# Patient Record
Sex: Male | Born: 1955
Health system: Southern US, Community
[De-identification: ages and names within clinical notes are randomized; demographics above are authoritative.]

## PROBLEM LIST (undated history)

## (undated) DIAGNOSIS — A4902 Methicillin resistant Staphylococcus aureus infection, unspecified site: Secondary | ICD-10-CM

## (undated) DIAGNOSIS — I10 Essential (primary) hypertension: Secondary | ICD-10-CM

## (undated) DIAGNOSIS — IMO0002 Reserved for concepts with insufficient information to code with codable children: Secondary | ICD-10-CM

## (undated) DIAGNOSIS — G43909 Migraine, unspecified, not intractable, without status migrainosus: Secondary | ICD-10-CM

## (undated) DIAGNOSIS — L0231 Cutaneous abscess of buttock: Secondary | ICD-10-CM

## (undated) DIAGNOSIS — K219 Gastro-esophageal reflux disease without esophagitis: Secondary | ICD-10-CM

## (undated) DIAGNOSIS — K259 Gastric ulcer, unspecified as acute or chronic, without hemorrhage or perforation: Secondary | ICD-10-CM

## (undated) DIAGNOSIS — N529 Male erectile dysfunction, unspecified: Secondary | ICD-10-CM

## (undated) DIAGNOSIS — D489 Neoplasm of uncertain behavior, unspecified: Secondary | ICD-10-CM

## (undated) DIAGNOSIS — L02415 Cutaneous abscess of right lower limb: Secondary | ICD-10-CM

## (undated) HISTORY — DX: Reserved for concepts with insufficient information to code with codable children: IMO0002

## (undated) HISTORY — DX: Male erectile dysfunction, unspecified: N52.9

## (undated) HISTORY — DX: Gastro-esophageal reflux disease without esophagitis: K21.9

## (undated) HISTORY — PX: FINGER TENDON REPAIR: SHX1640

## (undated) HISTORY — DX: Migraine, unspecified, not intractable, without status migrainosus: G43.909

## (undated) HISTORY — DX: Gastric ulcer, unspecified as acute or chronic, without hemorrhage or perforation: K25.9

## (undated) HISTORY — DX: Methicillin resistant Staphylococcus aureus infection, unspecified site: A49.02

## (undated) HISTORY — DX: Cutaneous abscess of right lower limb: L02.415

## (undated) HISTORY — DX: Cutaneous abscess of buttock: L02.31

## (undated) HISTORY — DX: Neoplasm of uncertain behavior, unspecified: D48.9

---

## 2011-08-08 ENCOUNTER — Ambulatory Visit: Payer: Self-pay | Admitting: Gastroenterology

## 2011-08-08 HISTORY — PX: COLONOSCOPY: SHX174

## 2011-08-08 LAB — HM COLONOSCOPY: HM Colonoscopy: 1

## 2013-06-17 DIAGNOSIS — IMO0002 Reserved for concepts with insufficient information to code with codable children: Secondary | ICD-10-CM

## 2013-06-17 HISTORY — DX: Reserved for concepts with insufficient information to code with codable children: IMO0002

## 2014-05-03 ENCOUNTER — Emergency Department: Payer: Self-pay | Admitting: Emergency Medicine

## 2014-05-10 ENCOUNTER — Emergency Department: Payer: Self-pay | Admitting: Emergency Medicine

## 2015-04-18 DIAGNOSIS — A4902 Methicillin resistant Staphylococcus aureus infection, unspecified site: Secondary | ICD-10-CM

## 2015-04-18 HISTORY — DX: Methicillin resistant Staphylococcus aureus infection, unspecified site: A49.02

## 2015-07-24 DIAGNOSIS — N529 Male erectile dysfunction, unspecified: Secondary | ICD-10-CM | POA: Insufficient documentation

## 2015-07-24 DIAGNOSIS — K259 Gastric ulcer, unspecified as acute or chronic, without hemorrhage or perforation: Secondary | ICD-10-CM | POA: Insufficient documentation

## 2015-07-24 DIAGNOSIS — D489 Neoplasm of uncertain behavior, unspecified: Secondary | ICD-10-CM | POA: Insufficient documentation

## 2015-08-02 ENCOUNTER — Ambulatory Visit (INDEPENDENT_AMBULATORY_CARE_PROVIDER_SITE_OTHER): Payer: BLUE CROSS/BLUE SHIELD | Admitting: Family Medicine

## 2015-08-02 ENCOUNTER — Encounter: Payer: Self-pay | Admitting: Family Medicine

## 2015-08-02 VITALS — BP 139/83 | HR 67 | Temp 98.2°F | Ht 67.0 in | Wt 165.0 lb

## 2015-08-02 DIAGNOSIS — Z0000 Encounter for general adult medical examination without abnormal findings: Secondary | ICD-10-CM

## 2015-08-02 DIAGNOSIS — N529 Male erectile dysfunction, unspecified: Secondary | ICD-10-CM | POA: Diagnosis not present

## 2015-08-02 MED ORDER — SILDENAFIL CITRATE 20 MG PO TABS: ORAL_TABLET | ORAL | Status: DC

## 2015-08-02 MED ORDER — TADALAFIL 5 MG PO TABS: 5.0000 mg | ORAL_TABLET | Freq: Every day | ORAL | Status: DC | PRN

## 2015-08-02 NOTE — Progress Notes (Signed)
Patient ID: Philip Atkins, male   DOB: 02/03/1956, 59 y.o.   MRN: [id number]  Subjective:   Philip Atkins is a 59 y.o. male here for a complete physical exam  Interim issues since last visit: tetanus booster with 20 stitches over right eye, head since last physical Feeling well overall Mosquito and spider bites this week; no tick bites  USPSTF grade A and B recommendations discussed Alcohol: n/a Tobacco: 8-10 cigs a day; tried e-cigs, then gave those up b/c formaldehyde HTN: checks at home, white coat up, controlled today; 120/78 at home Obesity: n/a HIV, Hep B, Hep C: declined Lipids: done at work already this year in the spring Glucose: ditto Colonoscopy: due next fall Lung cancer: not 30 pack year hx; will add up next year and consider Breast cancer: no males in the family or male with breast hx Osteoporosis: low risk AAA: not old enough, will get at 65 Aspirin: not taking Diet: few eggs; few fatty meats, mostly chicken; greens, dairy Exercise: walks a lot at work, 9-12 miles a day Skin cancer: already had skin cancer; learned lesson, for last 14-15 years, avoids sun, decent hat, sunscreen   Past Medical History  Diagnosis Date  . Neoplasm of uncertain behavior   . ED (erectile dysfunction)   . Gastric ulceration   . Migraines   . Squamous cell carcinoma 06/2013   Past Surgical History  Procedure Laterality Date  . Colonoscopy  08/08/11    1 benign polyp; one tubular adenoma; repeat 5 years   Family History  Problem Relation Age of Onset  . Heart attack Mother   . Hypertension Mother   . Asthma Brother    Social History  Substance Use Topics  . Smoking status: Current Every Day Smoker    Types: Cigarettes  . Smokeless tobacco: Never Used  . Alcohol Use: No   Review of Systems  Constitutional: Negative for fever and unexpected weight change.  HENT: Positive for congestion (sinus issues, weather change, pollen). Negative for nosebleeds.   Eyes: Negative  for visual disturbance.       Will have eyes tested  Respiratory: Negative for shortness of breath.   Cardiovascular: Negative for chest pain.  Gastrointestinal: Negative for abdominal pain and blood in stool.  Endocrine: Negative for cold intolerance, heat intolerance and polydipsia.  Genitourinary: Negative for hematuria, decreased urine volume and difficulty urinating.  Musculoskeletal: Negative for joint swelling and arthralgias.  Skin:       No worrisome moles  Allergic/Immunologic: Positive for environmental allergies (uses saline).  Neurological: Negative for tremors.  Hematological: Does not bruise/bleed easily.  Psychiatric/Behavioral: Negative for sleep disturbance and dysphoric mood.    Objective:   Filed Vitals:   08/02/15 1354  BP: 139/83  Pulse: 67  Temp: 98.2 F (36.8 C)  Height: 5\' 7"  (1.702 m)  Weight: 165 lb (74.844 kg)  SpO2: 97%   Body mass index is 25.84 kg/(m^2). Wt Readings from Last 3 Encounters:  08/02/15 165 lb (74.844 kg)  10/27/14 168 lb (76.204 kg)   Physical Exam  Constitutional: He appears well-developed and well-nourished. No distress.  HENT:  Head: Normocephalic and atraumatic.  Nose: Nose normal.  Mouth/Throat: Oropharynx is clear and moist.  Eyes: EOM are normal. No scleral icterus.  Neck: No JVD present. No thyromegaly present.  Cardiovascular: Normal rate, regular rhythm and normal heart sounds.   Pulmonary/Chest: Effort normal and breath sounds normal. No respiratory distress. He has no wheezes. He has no rales.  Abdominal: Soft.  Bowel sounds are normal. He exhibits no distension. There is no tenderness. There is no guarding.  Musculoskeletal: Normal range of motion. He exhibits no edema.  Lymphadenopathy:    He has no cervical adenopathy.  Neurological: He is alert. He displays normal reflexes. He exhibits normal muscle tone. Coordination normal.  Skin: Skin is warm and dry. No rash noted. He is not diaphoretic. No erythema. No  pallor.  Psychiatric: He has a normal mood and affect. His behavior is normal. Judgment and thought content normal.   Assessment/Plan:   Problem List Items Addressed This Visit      Genitourinary   ED (erectile dysfunction)    He would like prescriptions for the Cialis and sildenafil and see which works better; he knows to NOT take BOTH; he is aware of reason to go to ER (prolonged erection); do NOT take nitrates, etc.        Other   Preventative health care - Primary    Healthy living encouraged, USPSTF grade A and B recommendations reviewed; labs done through work; don't text and drive; check smoke detector batteries, etc; see AVS; return in one year for next physical         Meds ordered this encounter  Medications  . tadalafil (CIALIS) 5 MG tablet    Sig: Take 1 tablet (5 mg total) by mouth daily as needed for erectile dysfunction.    Dispense:  15 tablet    Refill:  6  . sildenafil (REVATIO) 20 MG tablet    Sig: One or two pills by mouth daily prior to intimacy; do NOT take with Cialis    Dispense:  30 tablet    Refill:  5   Follow up plan: Return in about 1 year (around 08/01/2016) for next physical.  An after-visit summary was printed and given to the patient at [place].  Please see the patient instructions which may contain other information and recommendations beyond what is mentioned above in the assessment and plan.  Meds ordered this encounter  Medications  . tadalafil (CIALIS) 5 MG tablet    Sig: Take 1 tablet (5 mg total) by mouth daily as needed for erectile dysfunction.    Dispense:  15 tablet    Refill:  6  . sildenafil (REVATIO) 20 MG tablet    Sig: One or two pills by mouth daily prior to intimacy; do NOT take with Cialis    Dispense:  30 tablet    Refill:  5  Patient to send labs done through work for health screening to me for review

## 2015-08-02 NOTE — Patient Instructions (Addendum)
A healthy BMI (or body mass index) is between 18.5 and 24.9 You can calculate your ideal BMI at the [place] website ClubMonetize.fr I do recommend 81 mg coated aspirin daily for heart protection We recommend no more than three egg yolks per week You can use either Cialis OR sildenafil (NOT both); never use either with nitrates Return in one year for next physical Please do feel free to send me copies of work labs  Health Maintenance A healthy lifestyle and preventative care can promote health and wellness.  Maintain regular health, dental, and eye exams.  Eat a healthy diet. Foods like vegetables, fruits, whole grains, low-fat dairy products, and lean protein foods contain the nutrients you need and are low in calories. Decrease your intake of foods high in solid fats, added sugars, and salt. Get information about a proper diet from your health care provider, if necessary.  Regular physical exercise is one of the most important things you can do for your health. Most adults should get at least 150 minutes of moderate-intensity exercise (any activity that increases your heart rate and causes you to sweat) each week. In addition, most adults need muscle-strengthening exercises on 2 or more days a week.   Maintain a healthy weight. The body mass index (BMI) is a screening tool to identify possible weight problems. It provides an estimate of body fat based on height and weight. Your health care provider can find your BMI and can help you achieve or maintain a healthy weight. For males 20 years and older:  A BMI below 18.5 is considered underweight.  A BMI of 18.5 to 24.9 is normal.  A BMI of 25 to 29.9 is considered overweight.  A BMI of 30 and above is considered obese.  Maintain normal blood lipids and cholesterol by exercising and minimizing your intake of saturated fat. Eat a balanced diet with plenty of fruits and vegetables. Blood tests  for lipids and cholesterol should begin at age 75 and be repeated every 5 years. If your lipid or cholesterol levels are high, you are over age 92, or you are at high risk for heart disease, you may need your cholesterol levels checked more frequently.Ongoing high lipid and cholesterol levels should be treated with medicines if diet and exercise are not working.  If you smoke, find out from your health care provider how to quit. If you do not use tobacco, do not start.  Lung cancer screening is recommended for adults aged 33-80 years who are at high risk for developing lung cancer because of a history of smoking. A yearly low-dose CT scan of the lungs is recommended for people who have at least a 30-pack-year history of smoking and are current smokers or have quit within the past 15 years. A pack year of smoking is smoking an average of 1 pack of cigarettes a day for 1 year (for example, a 30-pack-year history of smoking could mean smoking 1 pack a day for 30 years or 2 packs a day for 15 years). Yearly screening should continue until the smoker has stopped smoking for at least 15 years. Yearly screening should be stopped for people who develop a health problem that would prevent them from having lung cancer treatment.  If you choose to drink alcohol, do not have more than 2 drinks per day. One drink is considered to be 12 oz (360 mL) of beer, 5 oz (150 mL) of wine, or 1.5 oz (45 mL) of liquor.  Avoid the use  of street drugs. Do not share needles with anyone. Ask for help if you need support or instructions about stopping the use of drugs.  High blood pressure causes heart disease and increases the risk of stroke. Blood pressure should be checked at least every 1-2 years. Ongoing high blood pressure should be treated with medicines if weight loss and exercise are not effective.  If you are 4-12 years old, ask your health care provider if you should take aspirin to prevent heart disease.  Diabetes  screening involves taking a blood sample to check your fasting blood sugar level. This should be done once every 3 years after age 2 if you are at a normal weight and without risk factors for diabetes. Testing should be considered at a younger age or be carried out more frequently if you are overweight and have at least 1 risk factor for diabetes.  Colorectal cancer can be detected and often prevented. Most routine colorectal cancer screening begins at the age of 23 and continues through age 63. However, your health care provider may recommend screening at an earlier age if you have risk factors for colon cancer. On a yearly basis, your health care provider may provide home test kits to check for hidden blood in the stool. A small camera at the end of a tube may be used to directly examine the colon (sigmoidoscopy or colonoscopy) to detect the earliest forms of colorectal cancer. Talk to your health care provider about this at age 62 when routine screening begins. A direct exam of the colon should be repeated every 5-10 years through age 48, unless early forms of precancerous polyps or small growths are found.  People who are at an increased risk for hepatitis B should be screened for this virus. You are considered at high risk for hepatitis B if:  You were born in a country where hepatitis B occurs often. Talk with your health care provider about which countries are considered high risk.  Your parents were born in a high-risk country and you have not received a shot to protect against hepatitis B (hepatitis B vaccine).  You have HIV or AIDS.  You use needles to inject street drugs.  You live with, or have sex with, someone who has hepatitis B.  You are a man who has sex with other men (MSM).  You get hemodialysis treatment.  You take certain medicines for conditions like cancer, organ transplantation, and autoimmune conditions.  Hepatitis C blood testing is recommended for all people born  from [birth year] through 1966 and any individual with known risk factors for hepatitis C.  Healthy men should no longer receive prostate-specific antigen (PSA) blood tests as part of routine cancer screening. Talk to your health care provider about prostate cancer screening.  Testicular cancer screening is not recommended for adolescents or adult males who have no symptoms. Screening includes self-exam, a health care provider exam, and other screening tests. Consult with your health care provider about any symptoms you have or any concerns you have about testicular cancer.  Practice safe sex. Use condoms and avoid high-risk sexual practices to reduce the spread of sexually transmitted infections (STIs).  You should be screened for STIs, including gonorrhea and chlamydia if:  You are sexually active and are younger than 24 years.  You are older than 24 years, and your health care provider tells you that you are at risk for this type of infection.  Your sexual activity has changed since you were last screened,  and you are at an increased risk for chlamydia or gonorrhea. Ask your health care provider if you are at risk.  If you are at risk of being infected with HIV, it is recommended that you take a prescription medicine daily to prevent HIV infection. This is called pre-exposure prophylaxis (PrEP). You are considered at risk if:  You are a man who has sex with other men (MSM).  You are a heterosexual man who is sexually active with multiple partners.  You take drugs by injection.  You are sexually active with a partner who has HIV.  Talk with your health care provider about whether you are at high risk of being infected with HIV. If you choose to begin PrEP, you should first be tested for HIV. You should then be tested every 3 months for as long as you are taking PrEP.  Use sunscreen. Apply sunscreen liberally and repeatedly throughout the day. You should seek shade when your shadow is shorter  than you. Protect yourself by wearing long sleeves, pants, a wide-brimmed hat, and sunglasses year round whenever you are outdoors.  Tell your health care provider of new moles or changes in moles, especially if there is a change in shape or color. Also, tell your health care provider if a mole is larger than the size of a pencil eraser.  A one-time screening for abdominal aortic aneurysm (AAA) and surgical repair of large AAAs by ultrasound is recommended for men aged 60-75 years who are current or former smokers.  Stay current with your vaccines (immunizations). Document Released: 05/01/2008 Document Revised: 11/08/2013 Document Reviewed: 03/31/2011 [facility] Patient Information 2015 [place], [place]. This information is not intended to replace advice given to you by your health care provider. Make sure you discuss any questions you have with your health care provider.

## 2015-08-02 NOTE — Assessment & Plan Note (Signed)
He would like prescriptions for the Cialis and sildenafil and see which works better; he knows to NOT take BOTH; he is aware of reason to go to ER (prolonged erection); do NOT take nitrates, etc.

## 2015-08-02 NOTE — Assessment & Plan Note (Signed)
Healthy living encouraged, USPSTF grade A and B recommendations reviewed; labs done through work; don't text and drive; check smoke detector batteries, etc; see AVS; return in one year for next physical

## 2015-09-10 ENCOUNTER — Emergency Department
Admission: EM | Admit: 2015-09-10 | Discharge: 2015-09-10 | Disposition: A | Discharge status: Home / Self Care | Payer: Worker's Compensation | Attending: Internal Medicine | Admitting: Internal Medicine

## 2015-09-10 ENCOUNTER — Emergency Department: Payer: Worker's Compensation

## 2015-09-10 ENCOUNTER — Encounter: Payer: Self-pay | Admitting: Emergency Medicine

## 2015-09-10 DIAGNOSIS — S92401B Displaced unspecified fracture of right great toe, initial encounter for open fracture: Secondary | ICD-10-CM

## 2015-09-10 DIAGNOSIS — Y9389 Activity, other specified: Secondary | ICD-10-CM | POA: Diagnosis not present

## 2015-09-10 DIAGNOSIS — Y990 Civilian activity done for income or pay: Secondary | ICD-10-CM | POA: Diagnosis not present

## 2015-09-10 DIAGNOSIS — W208XXA Other cause of strike by thrown, projected or falling object, initial encounter: Secondary | ICD-10-CM | POA: Diagnosis not present

## 2015-09-10 DIAGNOSIS — Z792 Long term (current) use of antibiotics: Secondary | ICD-10-CM | POA: Diagnosis not present

## 2015-09-10 DIAGNOSIS — Y9289 Other specified places as the place of occurrence of the external cause: Secondary | ICD-10-CM | POA: Insufficient documentation

## 2015-09-10 DIAGNOSIS — Z79899 Other long term (current) drug therapy: Secondary | ICD-10-CM | POA: Diagnosis not present

## 2015-09-10 DIAGNOSIS — Z7982 Long term (current) use of aspirin: Secondary | ICD-10-CM | POA: Insufficient documentation

## 2015-09-10 DIAGNOSIS — Z720 Tobacco use: Secondary | ICD-10-CM | POA: Insufficient documentation

## 2015-09-10 DIAGNOSIS — S92421A Displaced fracture of distal phalanx of right great toe, initial encounter for closed fracture: Secondary | ICD-10-CM | POA: Insufficient documentation

## 2015-09-10 DIAGNOSIS — S97111A Crushing injury of right great toe, initial encounter: Secondary | ICD-10-CM

## 2015-09-10 IMAGING — CT CT HEAD WITHOUT CONTRAST
1 series · 16 of 30 positions shown, 20 images · non-contrast
Comparison: None.

CLINICAL DATA: Head trauma, laceration

EXAM:
CT HEAD WITHOUT CONTRAST
TECHNIQUE: Contiguous axial images were obtained from the base of the skull
through the vertex without intravenous contrast.

[Series 2: head wo · axial · 0.43mm/px · z∈[+14,+168]mm · 16 of 36 slices shown, 20 images]
[im 2/36  brain]
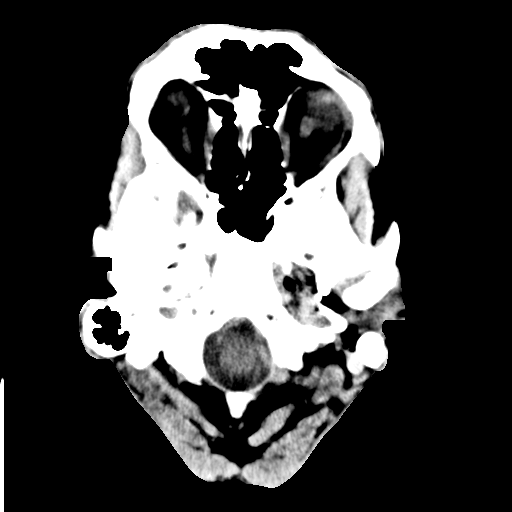
[im 2/36  bone]
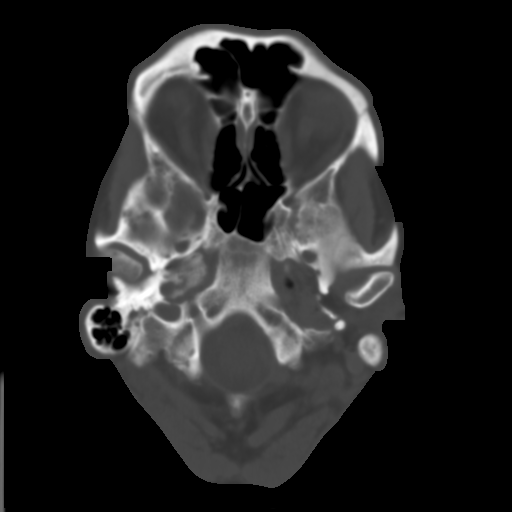
[im 4/36  brain]
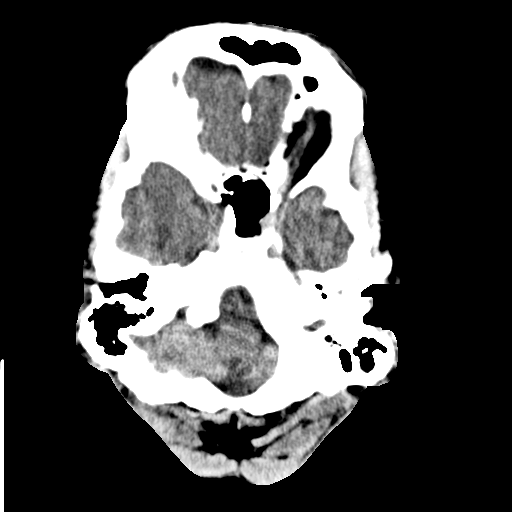
[im 7/36  brain]
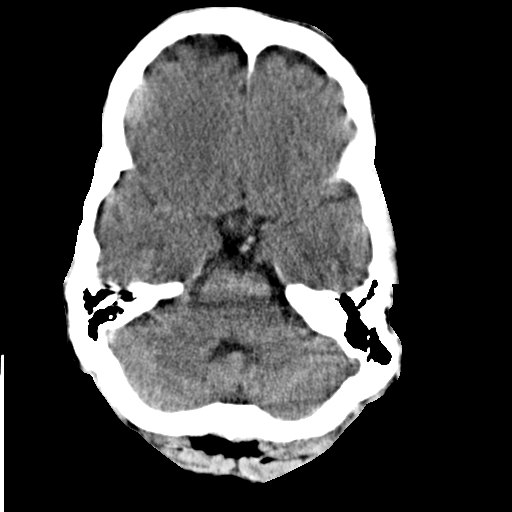
[im 9/36  brain]
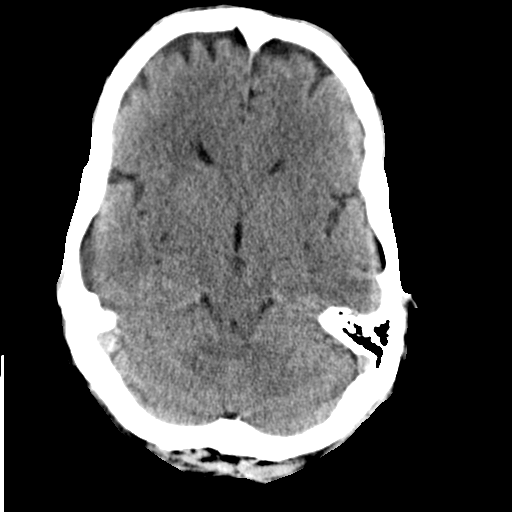
[im 10/36  brain]
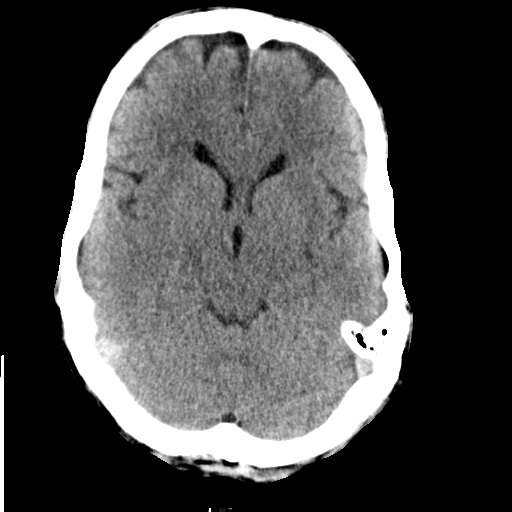
[im 10/36  bone]
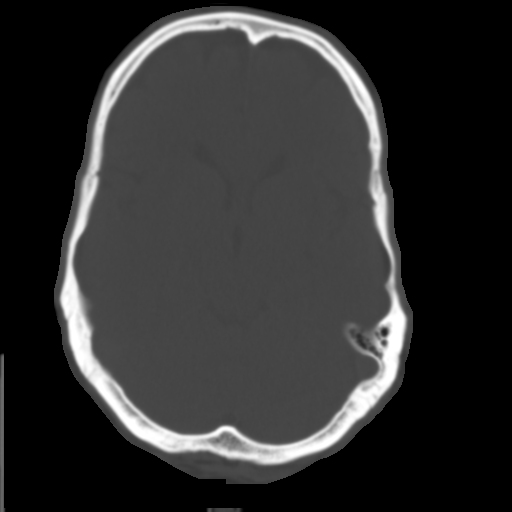
[im 13/36  brain]
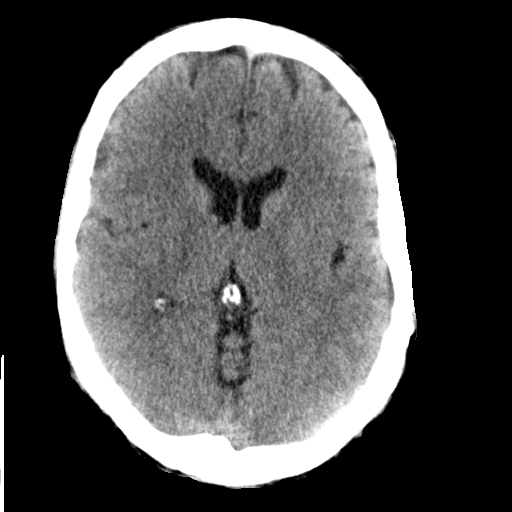
[im 15/36  brain]
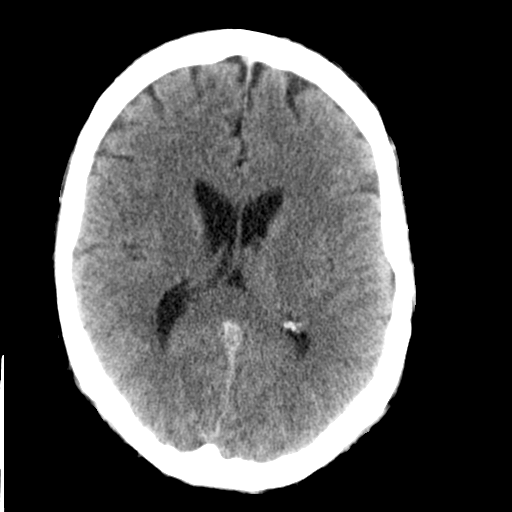
[im 17/36  brain]
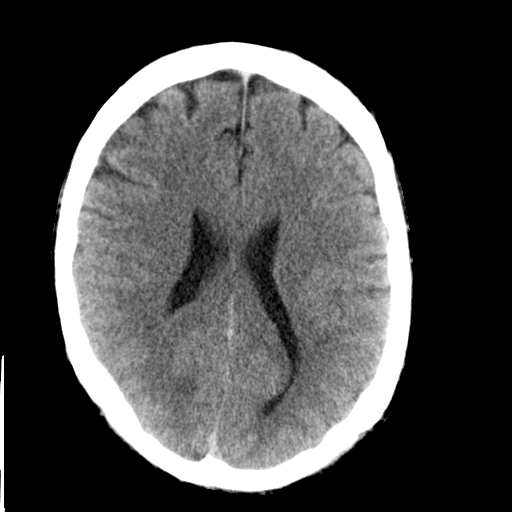
[im 19/36  brain]
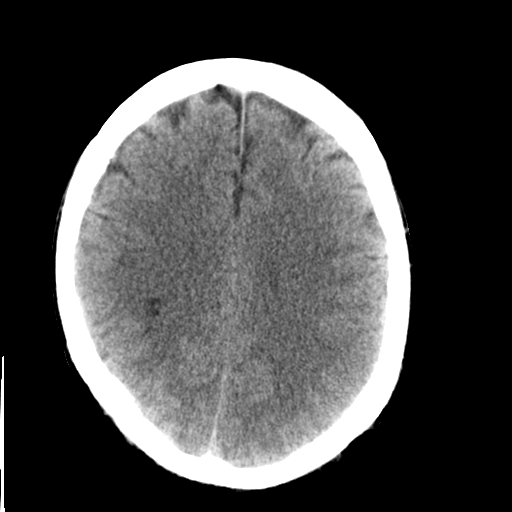
[im 19/36  bone]
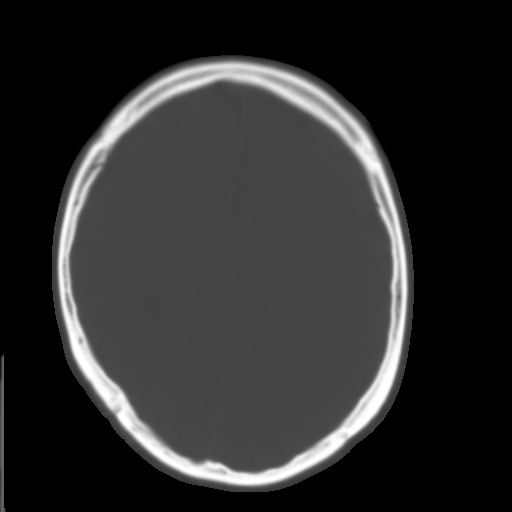
[im 21/36  brain]
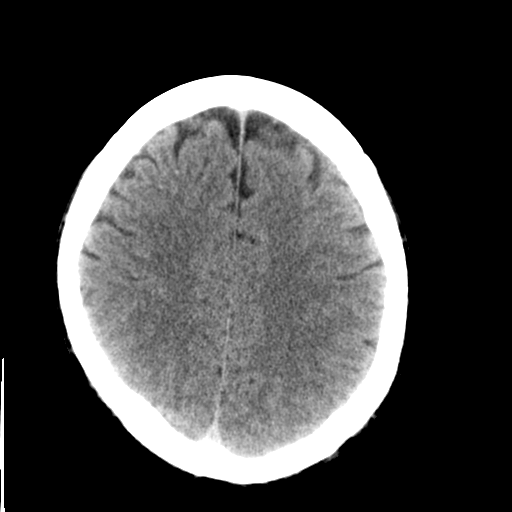
[im 23/36  brain]
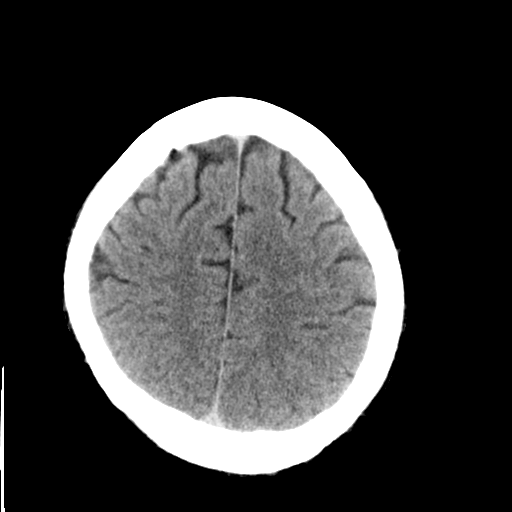
[im 26/36  brain]
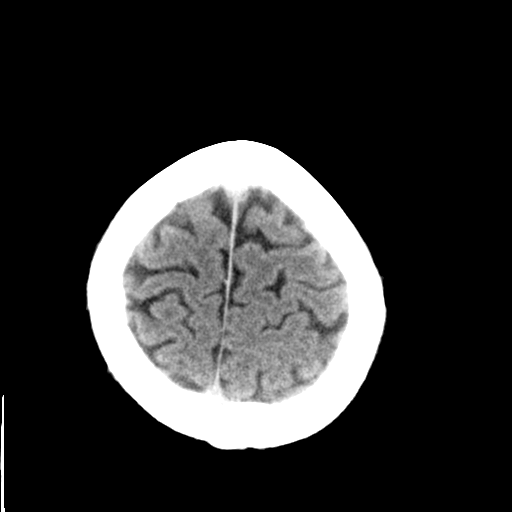
[im 27/36  brain]
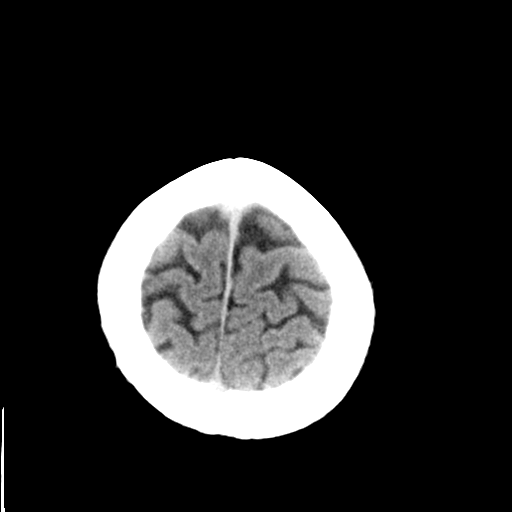
[im 27/36  bone]
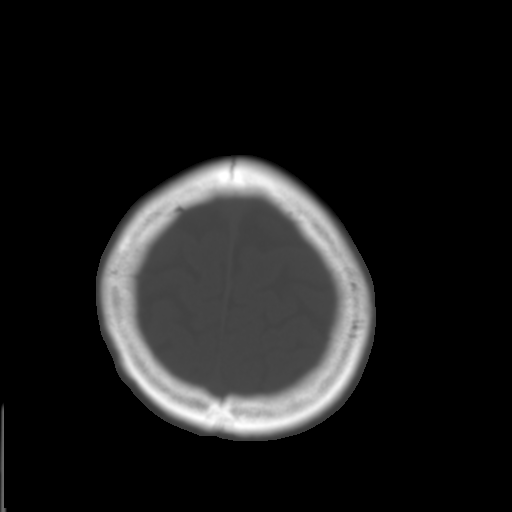
[im 29/36  brain]
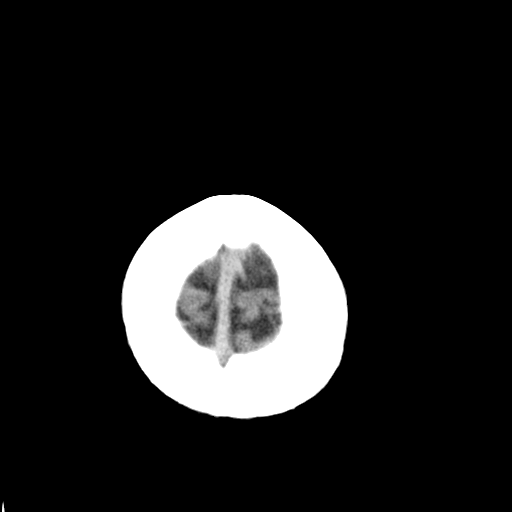
[im 32/36  brain]
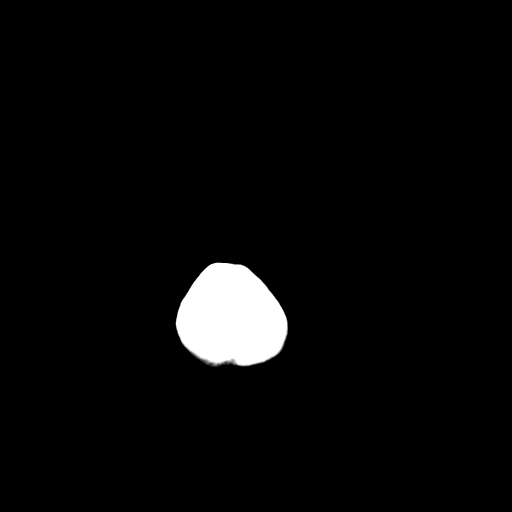
[im 34/36  brain]
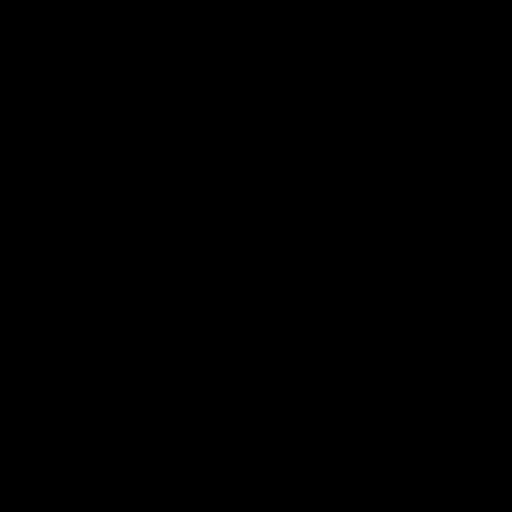

[16 of 30 positions shown; findings below may reference images not displayed]

FINDINGS: No skull fracture is noted. Paranasal sinuses and mastoid air cells
are unremarkable. No intracranial hemorrhage, mass effect or midline
shift. No intraventricular hemorrhage.

No hydrocephalus. The gray and white-matter differentiation is
preserved. No acute infarction. No mass lesion is noted on this
unenhanced scan.
IMPRESSION: No acute intracranial abnormality.

## 2015-09-10 MED ORDER — HYDROCODONE-ACETAMINOPHEN 5-325 MG PO TABS
2.0000 | ORAL_TABLET | Freq: Once | ORAL | Status: AC
  Administered 2015-09-10: 2 via ORAL
  Filled 2015-09-10: qty 2

## 2015-09-10 MED ORDER — CEPHALEXIN 500 MG PO CAPS: 500.0000 mg | ORAL_CAPSULE | Freq: Four times a day (QID) | ORAL | Status: DC

## 2015-09-10 MED ORDER — HYDROCODONE-ACETAMINOPHEN 5-325 MG PO TABS: 1.0000 | ORAL_TABLET | ORAL | Status: DC | PRN

## 2015-09-10 MED ORDER — CEPHALEXIN 500 MG PO CAPS
500.0000 mg | ORAL_CAPSULE | Freq: Once | ORAL | Status: AC
  Administered 2015-09-10: 500 mg via ORAL
  Filled 2015-09-10: qty 1

## 2015-09-10 MED ORDER — BACITRACIN ZINC 500 UNIT/GM EX OINT
TOPICAL_OINTMENT | CUTANEOUS | Status: AC
  Administered 2015-09-10: 1
  Filled 2015-09-10: qty 0.9

## 2015-09-10 NOTE — Discharge Instructions (Signed)
Crush Injury, Fingers or Toes A crush injury means the fingers or toes are hurt by being squeezed (compressed). HOME CARE  Raise (elevate) the injured part above the level of your heart. Do this as much as you can for the first few days.  Put ice on the injured area.  Put ice in a plastic bag.  Place a towel between your skin and the bag.  Leave the ice on for 15-20 minutes, 03-04 times a day for the first 2 days.  Only take medicine as told by your doctor.  Use the injured part only as told by your doctor.  Change bandages (dressings) as told by your doctor.  Keep all doctor visits as told. GET HELP RIGHT AWAY IF:   There is redness, puffiness (swelling), or more pain in the injured finger or toe.  Yellowish-white fluid (pus) comes from the wound.  You have a fever.  A bad smell comes from the wound or bandage.  The wound breaks open.  You cannot move the injured finger or toe. MAKE SURE YOU:   Understand these instructions.  Will watch your condition.  Will get help right away if you are not doing well or get worse.   This information is not intended to replace advice given to you by your health care provider. Make sure you discuss any questions you have with your health care provider.   Document Released: 04/23/2010 Document Revised: 01/26/2012 Document Reviewed: 03/21/2011 Elsevier Interactive Patient Education [employer].

## 2015-09-10 NOTE — ED Notes (Signed)
Foot cleansed, antibiotic ointment applied, sterile gauze over, kerlix wrap, wooden shoe

## 2015-09-10 NOTE — ED Provider Notes (Signed)
[facility] Emergency Department Provider Note  ____________________________________________  Time seen: Approximately 2:36 PM  I have reviewed the triage vital signs and the nursing notes.   HISTORY  Chief Complaint Foot Injury   HPI Philip Atkins is a 59 y.o. male here with injury to his right great toe. Patient was at work when a spring loaded plate came down on his foot. Patient was brought by a coworker/supervisor to the emergency room and his wife will be picking him up. Patient has had a tetanus shot within the last year. He does not have a picture ID so low risk, drug screen was not done. Patient rates his pain as 5 out of 10.   Past Medical History  Diagnosis Date  . Neoplasm of uncertain behavior   . ED (erectile dysfunction)   . Gastric ulceration   . Migraines   . Squamous cell carcinoma ([place]) 06/2013    Patient Active Problem List   Diagnosis Date Noted  . Preventative health care 08/02/2015  . Neoplasm of uncertain behavior   . ED (erectile dysfunction)   . Gastric ulceration     Past Surgical History  Procedure Laterality Date  . Colonoscopy  08/08/11    1 benign polyp; one tubular adenoma; repeat 5 years    Current Outpatient Rx  Name  Route  Sig  Dispense  Refill  . acetaminophen (TYLENOL) 500 MG tablet   Oral   Take 500 mg by mouth 2 (two) times daily.         Marland Kitchen aspirin EC 81 MG tablet   Oral   Take 81 mg by mouth daily.         . cephALEXin (KEFLEX) 500 MG capsule   Oral   Take 1 capsule (500 mg total) by mouth 4 (four) times daily.   28 capsule   0   . HYDROcodone-acetaminophen (NORCO/VICODIN) 5-325 MG tablet   Oral   Take 1-2 tablets by mouth every 4 (four) hours as needed for moderate pain.   30 tablet   0   . Multiple Vitamin (MULTIVITAMIN) tablet   Oral   Take 1 tablet by mouth daily.         . sildenafil (REVATIO) 20 MG tablet      One or two pills by mouth daily prior to intimacy; do NOT take  with Cialis   30 tablet   5   . tadalafil (CIALIS) 5 MG tablet   Oral   Take 1 tablet (5 mg total) by mouth daily as needed for erectile dysfunction.   15 tablet   6     Allergies Review of patient's allergies indicates no known allergies.  Family History  Problem Relation Age of Onset  . Heart attack Mother   . Hypertension Mother   . Asthma Brother     Social History Social History  Substance Use Topics  . Smoking status: Current Every Day Smoker -- 0.50 packs/day    Types: Cigarettes  . Smokeless tobacco: Never Used  . Alcohol Use: No    Review of Systems Constitutional: No fever/chills Cardiovascular: Denies chest pain. Respiratory: Denies shortness of breath. Gastrointestinal:   No nausea, no vomiting.  Musculoskeletal: Positive right foot pain Skin: Negative for rash. 10-point ROS otherwise negative.  ____________________________________________   PHYSICAL EXAM:  VITAL SIGNS: ED Triage Vitals  Enc Vitals Group     BP 09/10/15 1418 140/84 mmHg     Pulse Rate 09/10/15 1418 64  Resp 09/10/15 1418 18     Temp 09/10/15 1418 98.1 F (36.7 C)     Temp Source 09/10/15 1418 Oral     SpO2 09/10/15 1418 94 %     Weight 09/10/15 1418 168 lb (76.204 kg)     Height 09/10/15 1418 5\' 7"  (1.702 m)     Head Cir --      Peak Flow --      Pain Score 09/10/15 1421 5     Pain Loc --      Pain Edu? --      Excl. in [place]? --     Constitutional: Alert and oriented. Well appearing and in no acute distress. Eyes: Conjunctivae are normal. PERRL. EOMI. Head: Atraumatic. Nose: No congestion/rhinnorhea. Neck: No stridor.   Cardiovascular: Normal rate, regular rhythm. Grossly normal heart sounds.  Good peripheral circulation. Respiratory: Normal respiratory effort.  No retractions. Lungs CTAB. Gastrointestinal: Soft and nontender. No distention.  Musculoskeletal: Right toe with evidence of bleeding and nail intact. Cuticle is disrupted. No active bleeding was noted on  exam. Range of motion is limited secondary to pain. There is some edema present. There is some superficial appearing lacerations dorsal aspect of the great toe. Cap refill less than 2 seconds and motor sensory function intact. Neurologic:  Normal speech and language. No gross focal neurologic deficits are appreciated. No gait instability. Skin:  Skin is warm, dry and intact. See above injury under muscle skeletal. Psychiatric: Mood and affect are normal. Speech and behavior are normal.  ____________________________________________   LABS (all labs ordered are listed, but only abnormal results are displayed)  Labs Reviewed - No data to display   RADIOLOGY  Right great toe radiologist and reviewed by me shows comminuted fracture of the first digit distal phalanx. I, Johnn Hai, personally viewed and evaluated these images (plain radiographs) as part of my medical decision making.  ____________________________________________   PROCEDURES  Procedure(s) performed: None  Critical Care performed: No  ____________________________________________   INITIAL IMPRESSION / ASSESSMENT AND PLAN / ED COURSE  Pertinent labs & imaging results that were available during my care of the patient were reviewed by me and considered in my medical decision making (see chart for details).  Patient is to follow-up with podiatry for his fractured great toe. He is also placed on Keflex 500 mg 4 times a day for 7 days. Patient was given a prescription for Norco as needed for pain. He'll be given a note for work stating that he is to be in a wooden shoe. He is to return to the emergency room if any severe worsening of his symptoms or any signs of infection. ____________________________________________   FINAL CLINICAL IMPRESSION(S) / ED DIAGNOSES  Final diagnoses:  Fracture of right great toe, open, initial encounter  Crushing injury of right great toe, initial encounter      Johnn Hai,  PA-C 09/10/15 1557  Earleen Newport, MD 09/11/15 1300

## 2015-09-10 NOTE — ED Notes (Signed)
Pt does not have any id.  Nurse is aware.  No w/c done

## 2015-09-10 NOTE — ED Notes (Signed)
States dock plate came down on foot, bleeding noted through dressing

## 2015-11-02 ENCOUNTER — Encounter: Payer: Self-pay | Admitting: Family Medicine

## 2015-11-02 ENCOUNTER — Ambulatory Visit (INDEPENDENT_AMBULATORY_CARE_PROVIDER_SITE_OTHER): Payer: BLUE CROSS/BLUE SHIELD | Admitting: Family Medicine

## 2015-11-02 VITALS — BP 163/78 | HR 61 | Temp 98.2°F | Wt 173.0 lb

## 2015-11-02 DIAGNOSIS — IMO0001 Reserved for inherently not codable concepts without codable children: Secondary | ICD-10-CM

## 2015-11-02 DIAGNOSIS — R030 Elevated blood-pressure reading, without diagnosis of hypertension: Secondary | ICD-10-CM

## 2015-11-02 DIAGNOSIS — L02415 Cutaneous abscess of right lower limb: Secondary | ICD-10-CM | POA: Diagnosis not present

## 2015-11-02 DIAGNOSIS — L0231 Cutaneous abscess of buttock: Secondary | ICD-10-CM | POA: Diagnosis not present

## 2015-11-02 MED ORDER — SULFAMETHOXAZOLE-TRIMETHOPRIM 800-160 MG PO TABS: 1.0000 | ORAL_TABLET | Freq: Two times a day (BID) | ORAL | Status: DC

## 2015-11-02 NOTE — Patient Instructions (Signed)
Warm compresses over sites Start antibiotics Please do eat yogurt daily or take a probiotic daily for the next month or two We want to replace the healthy germs in the gut If you notice foul, watery diarrhea in the next two months, schedule an appointment RIGHT AWAY

## 2015-11-02 NOTE — Progress Notes (Signed)
BP 163/78 mmHg  Pulse 61  Temp(Src) 98.2 F (36.8 C)  Wt 173 lb (78.472 kg)  SpO2 98%   Subjective:    Patient ID: Philip Atkins, male    DOB: 10/05/1956, 59 y.o.   MRN: [id number]  HPI: Philip Atkins is a 59 y.o. male  Chief Complaint  Patient presents with  . MRSA    He was diagnosed with MRSA in the past, he had one in his groin area but it's going away but has a new one on his buttocks.   He has a spot on the right hip which broke out, got red, then drainage white pus and it's getting better, but now has another spot on the buttock; no fevers, just headaches; feels tired; no swollen lymph nodes He thinks he has a spot of MRSA; came out and erupted and burst on its own; he has had this before  Had this back in July; thinks he got it from his mother-in-law and it went around the house; he took TMP/SMX DS and it cleared up in just 3 days  Broke his right toe 6 weeks ago, went to the hospital; it was a mess he says (showed picture); plate came down on the foot, had to have toenail removed  Relevant past medical, surgical, family and social history reviewed and updated as indicated. Interim medical history since our last visit reviewed. Broke right great toe; dock plate at work, came down on foot; just now back in shoes  His blood pressure is usually 120-130 away from the doctor, just goes up at dentists and doctors and hospitals; white coat hypertension  Allergies and medications reviewed and updated.  Review of Systems  Per HPI unless specifically indicated above     Objective:    BP 163/78 mmHg  Pulse 61  Temp(Src) 98.2 F (36.8 C)  Wt 173 lb (78.472 kg)  SpO2 98%  Wt Readings from Last 3 Encounters:  11/02/15 173 lb (78.472 kg)  09/10/15 168 lb (76.204 kg)  08/02/15 165 lb (74.844 kg)    Physical Exam  Constitutional: He appears well-developed and well-nourished.  Eyes: No scleral icterus.  Cardiovascular: Normal rate.   Pulmonary/Chest: Effort normal.   Lymphadenopathy:       Right: No inguinal adenopathy present.  Skin: Lesion (anterior right hip, erythematous lesion with no active drainage; right buttock, erythematous papule with induration, no drainage) noted.  Psychiatric: He has a normal mood and affect.   Results for orders placed or performed in visit on 07/24/15  HM COLONOSCOPY  Result Value Ref Range   HM Colonoscopy 1 benign polyp, 1 tuubular adenoma; repeat 5 years       Assessment & Plan:   Problem List Items Addressed This Visit      Other   Abscess of right buttock - Primary    Does not appear amenable to I&D at this time, but there is induration and definite erythema suggestive of forming abscess, cellulitis; start ABX; warm compresses to area; contact me or seek care if getting larger; discussed risk of C diff (he is aware), and ingest yogurt or probiotics for next few weks; see AVS      Abscess of right hip    Anterior right hip; no localized inguinal lymphadenopathy; this appears to to be resolving, with minimal erythema, no active drainage; starting ABX for buttocks lesion which should take care of both places      Elevated blood pressure    Patient maintains that this  is white coat hypertension; he will monitor at home and contact me if not controlled         Follow up plan: Return if symptoms worsen or fail to improve.  An after-visit summary was printed and given to the patient at [place].  Please see the patient instructions which may contain other information and recommendations beyond what is mentioned above in the assessment and plan. Meds ordered this encounter  Medications  . DISCONTD: sulfamethoxazole-trimethoprim (BACTRIM DS,SEPTRA DS) 800-160 MG tablet    Sig: Take 1 tablet by mouth 2 (two) times daily.    Dispense:  14 tablet    Refill:  0  . sulfamethoxazole-trimethoprim (BACTRIM DS,SEPTRA DS) 800-160 MG tablet    Sig: Take 1 tablet by mouth 2 (two) times daily.    Dispense:  14 tablet     Refill:  0

## 2015-11-03 DIAGNOSIS — L0231 Cutaneous abscess of buttock: Secondary | ICD-10-CM

## 2015-11-03 DIAGNOSIS — IMO0001 Reserved for inherently not codable concepts without codable children: Secondary | ICD-10-CM | POA: Insufficient documentation

## 2015-11-03 DIAGNOSIS — L02415 Cutaneous abscess of right lower limb: Secondary | ICD-10-CM | POA: Insufficient documentation

## 2015-11-03 DIAGNOSIS — R030 Elevated blood-pressure reading, without diagnosis of hypertension: Secondary | ICD-10-CM

## 2015-11-03 HISTORY — DX: Cutaneous abscess of buttock: L02.31

## 2015-11-03 HISTORY — DX: Cutaneous abscess of right lower limb: L02.415

## 2015-11-03 NOTE — Assessment & Plan Note (Signed)
Does not appear amenable to I&D at this time, but there is induration and definite erythema suggestive of forming abscess, cellulitis; start ABX; warm compresses to area; contact me or seek care if getting larger; discussed risk of C diff (he is aware), and ingest yogurt or probiotics for next few weks; see AVS

## 2015-11-03 NOTE — Assessment & Plan Note (Signed)
Anterior right hip; no localized inguinal lymphadenopathy; this appears to to be resolving, with minimal erythema, no active drainage; starting ABX for buttocks lesion which should take care of both places

## 2015-11-03 NOTE — Assessment & Plan Note (Signed)
Patient maintains that this is white coat hypertension; he will monitor at home and contact me if not controlled

## 2015-12-12 ENCOUNTER — Telehealth: Payer: Self-pay | Admitting: Family Medicine

## 2015-12-12 MED ORDER — SULFAMETHOXAZOLE-TRIMETHOPRIM 800-160 MG PO TABS: 1.0000 | ORAL_TABLET | Freq: Two times a day (BID) | ORAL | Status: DC

## 2015-12-12 MED ORDER — MUPIROCIN CALCIUM 2 % NA OINT: 1.0000 "application " | TOPICAL_OINTMENT | Freq: Two times a day (BID) | NASAL | Status: DC

## 2015-12-12 NOTE — Telephone Encounter (Signed)
Routing to provider for advice.

## 2015-12-12 NOTE — Telephone Encounter (Signed)
Pt calling requesting more antibiotics.  I explained that requires and appt and he refused stating he was just seen a month ago for the same thing.  Please call pt to advise.

## 2015-12-12 NOTE — Telephone Encounter (Signed)
I talked with patient; he is requesting antibiotic; will start oral and nasal; if not getting better, then do return

## 2016-08-12 ENCOUNTER — Encounter (INDEPENDENT_AMBULATORY_CARE_PROVIDER_SITE_OTHER): Payer: Self-pay

## 2016-08-29 ENCOUNTER — Encounter: Payer: BLUE CROSS/BLUE SHIELD | Admitting: Family Medicine

## 2016-09-11 ENCOUNTER — Ambulatory Visit (INDEPENDENT_AMBULATORY_CARE_PROVIDER_SITE_OTHER): Payer: BLUE CROSS/BLUE SHIELD | Admitting: Family Medicine

## 2016-09-11 ENCOUNTER — Other Ambulatory Visit: Payer: Self-pay | Admitting: Family Medicine

## 2016-09-11 ENCOUNTER — Encounter: Payer: Self-pay | Admitting: Family Medicine

## 2016-09-11 VITALS — BP 134/80 | HR 69 | Temp 98.3°F | Ht 67.0 in | Wt 172.8 lb

## 2016-09-11 DIAGNOSIS — Z1211 Encounter for screening for malignant neoplasm of colon: Secondary | ICD-10-CM

## 2016-09-11 DIAGNOSIS — N529 Male erectile dysfunction, unspecified: Secondary | ICD-10-CM | POA: Diagnosis not present

## 2016-09-11 DIAGNOSIS — R030 Elevated blood-pressure reading, without diagnosis of hypertension: Secondary | ICD-10-CM

## 2016-09-11 DIAGNOSIS — Z125 Encounter for screening for malignant neoplasm of prostate: Secondary | ICD-10-CM

## 2016-09-11 DIAGNOSIS — Z13220 Encounter for screening for lipoid disorders: Secondary | ICD-10-CM | POA: Diagnosis not present

## 2016-09-11 DIAGNOSIS — I129 Hypertensive chronic kidney disease with stage 1 through stage 4 chronic kidney disease, or unspecified chronic kidney disease: Secondary | ICD-10-CM | POA: Insufficient documentation

## 2016-09-11 DIAGNOSIS — Z0000 Encounter for general adult medical examination without abnormal findings: Secondary | ICD-10-CM

## 2016-09-11 DIAGNOSIS — F17210 Nicotine dependence, cigarettes, uncomplicated: Secondary | ICD-10-CM | POA: Insufficient documentation

## 2016-09-11 DIAGNOSIS — Z720 Tobacco use: Secondary | ICD-10-CM | POA: Insufficient documentation

## 2016-09-11 MED ORDER — SILDENAFIL CITRATE 20 MG PO TABS: ORAL_TABLET | ORAL | 12 refills | Status: DC

## 2016-09-11 MED ORDER — NICOTINE 7 MG/24HR TD PT24: 7.0000 mg | MEDICATED_PATCH | Freq: Every day | TRANSDERMAL | 0 refills | Status: DC

## 2016-09-11 MED ORDER — NICOTINE 14 MG/24HR TD PT24: 14.0000 mg | MEDICATED_PATCH | Freq: Every day | TRANSDERMAL | 0 refills | Status: DC

## 2016-09-11 NOTE — Assessment & Plan Note (Signed)
Better on recheck. Call with any concerns. Microalbumin done today.

## 2016-09-11 NOTE — Assessment & Plan Note (Signed)
Would like to try patches again. Rx given today. Call with any concerns.

## 2016-09-11 NOTE — Assessment & Plan Note (Signed)
Does well on the sildenifil. Would like to continue with it. Rx given today.

## 2016-09-11 NOTE — Progress Notes (Signed)
BP 134/80 (BP Location: Left Arm, Cuff Size: Normal)   Pulse 69   Temp 98.3 F (36.8 C)   Ht 5\' 7"  (1.702 m)   Wt 172 lb 12.5 oz (78.4 kg)   SpO2 96%   BMI 27.06 kg/m    Subjective:    Patient ID: Philip Atkins, male    DOB: [birth year], 60 y.o.   MRN: [id number]  HPI: Philip Atkins is a 60 y.o. male presenting on 09/11/2016 for comprehensive medical examination. Current medical complaints include:  ELEVATED BLOOD PRESSURE Duration of elevated BP: unknown BP monitoring frequency: a few times a month BP range: 120s/80s Previous BP meds: no Recent stressors: no Family history of hypertension: yes Recurrent headaches: no Visual changes: no Palpitations: no  Dyspnea: no Chest pain: no Lower extremity edema: no Dizzy/lightheaded: no Transient ischemic attacks: no  Was on viagra and cialis, likes the viagra. Does well with it. No concerns. Would like a refill.   SMOKING CESSATION Smoking Status: current every day smoker Smoking Amount: 8-12 cigarettes a day Smoking Quit Date: Not set Smoking triggers: stress Type of tobacco use: cigarettes Children in the house: no Other household members who smoke: no Treatments attempted: patches Pneumovax: Declined  He currently lives with: wife Interim Problems from his last visit: yes, getting over bronchitis had it for past 2.5 weeks, now seeming to do better.   Depression Screen done today and results listed below:  Depression screen [facility] 2/9 09/11/2016  Decreased Interest 0  Down, Depressed, Hopeless 0  PHQ - 2 Score 0     Past Medical History:  Past Medical History:  Diagnosis Date  . Abscess of right buttock 11/03/2015  . Abscess of right hip 11/03/2015  . ED (erectile dysfunction)   . Gastric ulceration   . Migraines   . MRSA (methicillin resistant Staphylococcus aureus) June 2016  . Neoplasm of uncertain behavior   . Neoplasm of uncertain behavior   . Squamous cell carcinoma 06/2013    Surgical History:    Past Surgical History:  Procedure Laterality Date  . COLONOSCOPY  08/08/11   1 benign polyp; one tubular adenoma; repeat 5 years    Medications:  Current Outpatient Prescriptions on File Prior to Visit  Medication Sig  . acetaminophen (TYLENOL) 500 MG tablet Take 500 mg by mouth 2 (two) times daily.  Marland Kitchen aspirin EC 81 MG tablet Take 81 mg by mouth daily.  . Multiple Vitamin (MULTIVITAMIN) tablet Take 1 tablet by mouth daily.   No current facility-administered medications on file prior to visit.     Allergies:  No Known Allergies  Social History:  Social History   Social History  . Marital status: Married    Spouse name: N/A  . Number of children: N/A  . Years of education: N/A   Occupational History  . Not on file.   Social History Main Topics  . Smoking status: Current Every Day Smoker    Packs/day: 0.50    Types: Cigarettes  . Smokeless tobacco: Never Used  . Alcohol use No  . Drug use: No  . Sexual activity: Not on file   Other Topics Concern  . Not on file   Social History Narrative  . No narrative on file   History  Smoking Status  . Current Every Day Smoker  . Packs/day: 0.50  . Types: Cigarettes  Smokeless Tobacco  . Never Used   History  Alcohol Use No    Family History:  Family History  Problem Relation Age of Onset  . Heart attack Mother   . Hypertension Mother   . Heart disease Mother   . Asthma Brother   . Diabetes Brother   . Cancer Neg Hx   . COPD Neg Hx   . Stroke Neg Hx     Past medical history, surgical history, medications, allergies, family history and social history reviewed with patient today and changes made to appropriate areas of the chart.   Review of Systems  Constitutional: Negative.   HENT: Negative.   Eyes: Negative.   Respiratory: Negative.   Cardiovascular: Negative.   Gastrointestinal: Positive for heartburn (usually with what he eats). Negative for abdominal pain, blood in stool, constipation, diarrhea,  melena, nausea and vomiting.  Genitourinary: Negative.   Musculoskeletal: Negative.   Skin: Negative.   Neurological: Negative.   Endo/Heme/Allergies: Positive for environmental allergies. Negative for polydipsia. Bruises/bleeds easily.  Psychiatric/Behavioral: Negative.     All other ROS negative except what is listed above and in the HPI.      Objective:    BP 134/80 (BP Location: Left Arm, Cuff Size: Normal)   Pulse 69   Temp 98.3 F (36.8 C)   Ht 5\' 7"  (1.702 m)   Wt 172 lb 12.5 oz (78.4 kg)   SpO2 96%   BMI 27.06 kg/m   Wt Readings from Last 3 Encounters:  09/11/16 172 lb 12.5 oz (78.4 kg)  11/02/15 173 lb (78.5 kg)  09/10/15 168 lb (76.2 kg)    Physical Exam  Constitutional: He is oriented to person, place, and time. He appears well-developed and well-nourished. No distress.  HENT:  Head: Normocephalic and atraumatic.  Right Ear: Hearing, tympanic membrane, external ear and ear canal normal.  Left Ear: Hearing, tympanic membrane, external ear and ear canal normal.  Nose: Nose normal.  Mouth/Throat: Uvula is midline, oropharynx is clear and moist and mucous membranes are normal. No oropharyngeal exudate.  Eyes: Conjunctivae, EOM and lids are normal. Pupils are equal, round, and reactive to light. Right eye exhibits no discharge. Left eye exhibits no discharge. No scleral icterus.  Neck: Normal range of motion. Neck supple. No JVD present. No tracheal deviation present. No thyromegaly present.  Cardiovascular: Normal rate, regular rhythm, normal heart sounds and intact distal pulses.  Exam reveals no gallop and no friction rub.   No murmur heard. Pulmonary/Chest: Effort normal and breath sounds normal. No stridor. No respiratory distress. He has no wheezes. He has no rales. He exhibits no tenderness.  Abdominal: Soft. Bowel sounds are normal. He exhibits no distension and no mass. There is no tenderness. There is no rebound and no guarding. Hernia confirmed negative in  the right inguinal area and confirmed negative in the left inguinal area.  Genitourinary: Testes normal and penis normal. Rectal exam shows external hemorrhoid. Rectal exam shows no internal hemorrhoid, no fissure, no mass, no tenderness and anal tone normal. Prostate is enlarged. Prostate is not tender. Cremasteric reflex is present. Right testis shows no mass, no swelling and no tenderness. Right testis is descended. Cremasteric reflex is not absent on the right side. Left testis shows no mass, no swelling and no tenderness. Left testis is descended. Cremasteric reflex is not absent on the left side. Uncircumcised. No phimosis, paraphimosis, hypospadias, penile erythema or penile tenderness. No discharge found.  Musculoskeletal: Normal range of motion. He exhibits no edema, tenderness or deformity.  Lymphadenopathy:    He has no cervical adenopathy.  Neurological: He is alert and oriented to  person, place, and time. He has normal reflexes. He displays normal reflexes. No cranial nerve deficit. He exhibits normal muscle tone. Coordination normal.  Skin: Skin is warm, dry and intact. No rash noted. He is not diaphoretic. No erythema. No pallor.  Psychiatric: He has a normal mood and affect. His speech is normal and behavior is normal. Judgment and thought content normal. Cognition and memory are normal.  Nursing note and vitals reviewed.   Results for orders placed or performed in visit on 07/24/15  HM COLONOSCOPY  Result Value Ref Range   HM Colonoscopy 1 benign polyp, 1 tuubular adenoma; repeat 5 years       Assessment & Plan:   Problem List Items Addressed This Visit      Genitourinary   ED (erectile dysfunction)    Does well on the sildenifil. Would like to continue with it. Rx given today.        Other   Elevated BP without diagnosis of hypertension    Better on recheck. Call with any concerns. Microalbumin done today.       Tobacco abuse    Would like to try patches again. Rx  given today. Call with any concerns.        Other Visit Diagnoses    Routine general medical examination at a health care facility    -  Primary   Up to date or checking on vaccines. Screening labs checked today. Colonoscopy due. Continue diet and exercise. Call with any concerns.    Screening for cholesterol level       Labs drawn today, await results.    Screening for prostate cancer       Labs drawn today, await results.    Screening for colon cancer       Just got a letter, will call with who he saw so we can put in new referral       Discussed aspirin prophylaxis for myocardial infarction prevention and decision was it was not indicated  LABORATORY TESTING:  Health maintenance labs ordered today as discussed above.   The natural history of prostate cancer and ongoing controversy regarding screening and potential treatment outcomes of prostate cancer has been discussed with the patient. The meaning of a false positive PSA and a false negative PSA has been discussed. He indicates understanding of the limitations of this screening test and wishes to proceed with screening PSA testing.   IMMUNIZATIONS:   - Tdap: Tetanus vaccination status reviewed: last tetanus booster within 10 years. - Influenza: Given elsewhere - Pneumovax: Refused - Prevnar: Not applicable - Zostavax vaccine: Will check with his insurance  SCREENING: - Colonoscopy: Just got a letter, will call with who he saw so we can put in new referral Discussed with patient purpose of the colonoscopy is to detect colon cancer at curable precancerous or early stages   PATIENT COUNSELING:    Sexuality: Discussed sexually transmitted diseases, partner selection, use of condoms, avoidance of unintended pregnancy  and contraceptive alternatives.   Advised to avoid cigarette smoking.  I discussed with the patient that most people either abstain from alcohol or drink within safe limits (<=14/week and <=4 drinks/occasion for  males, <=7/weeks and <= 3 drinks/occasion for females) and that the risk for alcohol disorders and other health effects rises proportionally with the number of drinks per week and how often a drinker exceeds daily limits.  Discussed cessation/primary prevention of drug use and availability of treatment for abuse.   Diet: Encouraged to  adjust caloric intake to maintain  or achieve ideal body weight, to reduce intake of dietary saturated fat and total fat, to limit sodium intake by avoiding high sodium foods and not adding table salt, and to maintain adequate dietary potassium and calcium preferably from fresh fruits, vegetables, and low-fat dairy products.    stressed the importance of regular exercise  Injury prevention: Discussed safety belts, safety helmets, smoke detector, smoking near bedding or upholstery.   Dental health: Discussed importance of regular tooth brushing, flossing, and dental visits.   Follow up plan: NEXT PREVENTATIVE PHYSICAL DUE IN 1 YEAR. Return in about 1 year (around 09/11/2017) for Physical.

## 2016-09-11 NOTE — Patient Instructions (Addendum)
Health Maintenance, Male A healthy lifestyle and preventative care can promote health and wellness.  Maintain regular health, dental, and eye exams.  Eat a healthy diet. Foods like vegetables, fruits, whole grains, low-fat dairy products, and lean protein foods contain the nutrients you need and are low in calories. Decrease your intake of foods high in solid fats, added sugars, and salt. Get information about a proper diet from your health care provider, if necessary.  Regular physical exercise is one of the most important things you can do for your health. Most adults should get at least 150 minutes of moderate-intensity exercise (any activity that increases your heart rate and causes you to sweat) each week. In addition, most adults need muscle-strengthening exercises on 2 or more days a week.   Maintain a healthy weight. The body mass index (BMI) is a screening tool to identify possible weight problems. It provides an estimate of body fat based on height and weight. Your health care provider can find your BMI and can help you achieve or maintain a healthy weight. For males 20 years and older:  A BMI below 18.5 is considered underweight.  A BMI of 18.5 to 24.9 is normal.  A BMI of 25 to 29.9 is considered overweight.  A BMI of 30 and above is considered obese.  Maintain normal blood lipids and cholesterol by exercising and minimizing your intake of saturated fat. Eat a balanced diet with plenty of fruits and vegetables. Blood tests for lipids and cholesterol should begin at age 20 and be repeated every 5 years. If your lipid or cholesterol levels are high, you are over age 50, or you are at high risk for heart disease, you may need your cholesterol levels checked more frequently.Ongoing high lipid and cholesterol levels should be treated with medicines if diet and exercise are not working.  If you smoke, find out from your health care provider how to quit. If you do not use tobacco, do not  start.  Lung cancer screening is recommended for adults aged 55-80 years who are at high risk for developing lung cancer because of a history of smoking. A yearly low-dose CT scan of the lungs is recommended for people who have at least a 30-pack-year history of smoking and are current smokers or have quit within the past 15 years. A pack year of smoking is smoking an average of 1 pack of cigarettes a day for 1 year (for example, a 30-pack-year history of smoking could mean smoking 1 pack a day for 30 years or 2 packs a day for 15 years). Yearly screening should continue until the smoker has stopped smoking for at least 15 years. Yearly screening should be stopped for people who develop a health problem that would prevent them from having lung cancer treatment.  If you choose to drink alcohol, do not have more than 2 drinks per day. One drink is considered to be 12 oz (360 mL) of beer, 5 oz (150 mL) of wine, or 1.5 oz (45 mL) of liquor.  Avoid the use of street drugs. Do not share needles with anyone. Ask for help if you need support or instructions about stopping the use of drugs.  High blood pressure causes heart disease and increases the risk of stroke. High blood pressure is more likely to develop in:  People who have blood pressure in the end of the normal range (100-139/85-89 mm Hg).  People who are overweight or obese.  People who are African American.    If you are 18-39 years of age, have your blood pressure checked every 3-5 years. If you are 40 years of age or older, have your blood pressure checked every year. You should have your blood pressure measured twice--once when you are at a hospital or clinic, and once when you are not at a hospital or clinic. Record the average of the two measurements. To check your blood pressure when you are not at a hospital or clinic, you can use:  An automated blood pressure machine at a pharmacy.  A home blood pressure monitor.  If you are 45-79 years  old, ask your health care provider if you should take aspirin to prevent heart disease.  Diabetes screening involves taking a blood sample to check your fasting blood sugar level. This should be done once every 3 years after age 45 if you are at a normal weight and without risk factors for diabetes. Testing should be considered at a younger age or be carried out more frequently if you are overweight and have at least 1 risk factor for diabetes.  Colorectal cancer can be detected and often prevented. Most routine colorectal cancer screening begins at the age of 50 and continues through age 75. However, your health care provider may recommend screening at an earlier age if you have risk factors for colon cancer. On a yearly basis, your health care provider may provide home test kits to check for hidden blood in the stool. A small camera at the end of a tube may be used to directly examine the colon (sigmoidoscopy or colonoscopy) to detect the earliest forms of colorectal cancer. Talk to your health care provider about this at age 50 when routine screening begins. A direct exam of the colon should be repeated every 5-10 years through age 75, unless early forms of precancerous polyps or small growths are found.  People who are at an increased risk for hepatitis B should be screened for this virus. You are considered at high risk for hepatitis B if:  You were born in a country where hepatitis B occurs often. Talk with your health care provider about which countries are considered high risk.  Your parents were born in a high-risk country and you have not received a shot to protect against hepatitis B (hepatitis B vaccine).  You have HIV or AIDS.  You use needles to inject street drugs.  You live with, or have sex with, someone who has hepatitis B.  You are a man who has sex with other men (MSM).  You get hemodialysis treatment.  You take certain medicines for conditions like cancer, organ  transplantation, and autoimmune conditions.  Hepatitis C blood testing is recommended for all people born from 1945 through 1965 and any individual with known risk factors for hepatitis C.  Healthy men should no longer receive prostate-specific antigen (PSA) blood tests as part of routine cancer screening. Talk to your health care provider about prostate cancer screening.  Testicular cancer screening is not recommended for adolescents or adult males who have no symptoms. Screening includes self-exam, a health care provider exam, and other screening tests. Consult with your health care provider about any symptoms you have or any concerns you have about testicular cancer.  Practice safe sex. Use condoms and avoid high-risk sexual practices to reduce the spread of sexually transmitted infections (STIs).  You should be screened for STIs, including gonorrhea and chlamydia if:  You are sexually active and are younger than 24 years.  You   are older than 24 years, and your health care provider tells you that you are at risk for this type of infection.  Your sexual activity has changed since you were last screened, and you are at an increased risk for chlamydia or gonorrhea. Ask your health care provider if you are at risk.  If you are at risk of being infected with HIV, it is recommended that you take a prescription medicine daily to prevent HIV infection. This is called pre-exposure prophylaxis (PrEP). You are considered at risk if:  You are a man who has sex with other men (MSM).  You are a heterosexual man who is sexually active with multiple partners.  You take drugs by injection.  You are sexually active with a partner who has HIV.  Talk with your health care provider about whether you are at high risk of being infected with HIV. If you choose to begin PrEP, you should first be tested for HIV. You should then be tested every 3 months for as long as you are taking PrEP.  Use sunscreen. Apply  sunscreen liberally and repeatedly throughout the day. You should seek shade when your shadow is shorter than you. Protect yourself by wearing long sleeves, pants, a wide-brimmed hat, and sunglasses year round whenever you are outdoors.  Tell your health care provider of new moles or changes in moles, especially if there is a change in shape or color. Also, tell your health care provider if a mole is larger than the size of a pencil eraser.  A one-time screening for abdominal aortic aneurysm (AAA) and surgical repair of large AAAs by ultrasound is recommended for men aged 41-75 years who are current or former smokers.  Stay current with your vaccines (immunizations).   This information is not intended to replace advice given to you by your health care provider. Make sure you discuss any questions you have with your health care provider.   Document Released: 05/01/2008 Document Revised: 11/24/2014 Document Reviewed: 03/31/2011 Elsevier Interactive Patient Education 2016 [place] Can Quit Smoking If you are ready to quit smoking or are thinking about it, congratulations! You have chosen to help yourself be healthier and live longer! There are lots of different ways to quit smoking. Nicotine gum, nicotine patches, a nicotine inhaler, or nicotine nasal spray can help with physical craving. Hypnosis, support groups, and medicines help break the habit of smoking. TIPS TO GET OFF AND STAY OFF CIGARETTES  Learn to predict your moods. Do not let a bad situation be your excuse to have a cigarette. Some situations in your life might tempt you to have a cigarette.  Ask friends and co-workers not to smoke around you.  Make your home smoke-free.  Never have "just one" cigarette. It leads to wanting another and another. Remind yourself of your decision to quit.  On a card, make a list of your reasons for not smoking. Read it at least the same number of times a day as you have a cigarette. Tell  yourself everyday, "I do not want to smoke. I choose not to smoke."  Ask someone at home or work to help you with your plan to quit smoking.  Have something planned after you eat or have a cup of coffee. Take a walk or get other exercise to perk you up. This will help to keep you from overeating.  Try a relaxation exercise to calm you down and decrease your stress. Remember, you may be tense and nervous  the first two weeks after you quit. This will pass.  Find new activities to keep your hands busy. Play with a pen, coin, or rubber band. Doodle or draw things on paper.  Brush your teeth right after eating. This will help cut down the craving for the taste of tobacco after meals. You can try mouthwash too.  Try gum, breath mints, or diet candy to keep something in your mouth. IF YOU SMOKE AND WANT TO QUIT:  Do not stock up on cigarettes. Never buy a carton. Wait until one pack is finished before you buy another.  Never carry cigarettes with you at work or at home.  Keep cigarettes as far away from you as possible. Leave them with someone else.  Never carry matches or a lighter with you.  Ask yourself, "Do I need this cigarette or is this just a reflex?"  Bet with someone that you can quit. Put cigarette money in a piggy bank every morning. If you smoke, you give up the money. If you do not smoke, by the end of the week, you keep the money.  Keep trying. It takes 21 days to change a habit!  Talk to your doctor about using medicines to help you quit. These include nicotine replacement gum, lozenges, or skin patches.   This information is not intended to replace advice given to you by your health care provider. Make sure you discuss any questions you have with your health care provider.   Document Released: 08/30/2009 Document Revised: 01/26/2012 Document Reviewed: 08/30/2009 Elsevier Interactive Patient Education [employer].

## 2016-09-12 ENCOUNTER — Encounter: Payer: Self-pay | Admitting: Family Medicine

## 2016-09-12 LAB — COMPREHENSIVE METABOLIC PANEL
ALBUMIN: 4.7 g/dL (ref 3.6–4.8)
ALT: 21 IU/L (ref 0–44)
AST: 21 IU/L (ref 0–40)
Albumin/Globulin Ratio: 2 (ref 1.2–2.2)
Alkaline Phosphatase: 71 IU/L (ref 39–117)
BUN / CREAT RATIO: 16 (ref 10–24)
BUN: 15 mg/dL (ref 8–27)
Bilirubin Total: 0.4 mg/dL (ref 0.0–1.2)
CO2: 24 mmol/L (ref 18–29)
CREATININE: 0.96 mg/dL (ref 0.76–1.27)
Calcium: 9.6 mg/dL (ref 8.6–10.2)
Chloride: 101 mmol/L (ref 96–106)
GFR, EST AFRICAN AMERICAN: 98 mL/min/{1.73_m2} (ref >59)
GFR, EST NON AFRICAN AMERICAN: 85 mL/min/{1.73_m2} (ref >59)
GLOBULIN, TOTAL: 2.4 g/dL (ref 1.5–4.5)
GLUCOSE: 92 mg/dL (ref 65–99)
Potassium: 4.5 mmol/L (ref 3.5–5.2)
Sodium: 141 mmol/L (ref 134–144)
TOTAL PROTEIN: 7.1 g/dL (ref 6.0–8.5)

## 2016-09-12 LAB — CBC WITH DIFFERENTIAL/PLATELET
BASOS ABS: 0 10*3/uL (ref 0.0–0.2)
Basos: 0 %
EOS (ABSOLUTE): 0.1 10*3/uL (ref 0.0–0.4)
EOS: 2 %
HEMATOCRIT: 47.4 % (ref 37.5–51.0)
HEMOGLOBIN: 16.1 g/dL (ref 12.6–17.7)
IMMATURE GRANS (ABS): 0 10*3/uL (ref 0.0–0.1)
Immature Granulocytes: 0 %
LYMPHS: 27 %
Lymphocytes Absolute: 2.1 10*3/uL (ref 0.7–3.1)
MCH: 33.3 pg — ABNORMAL HIGH (ref 26.6–33.0)
MCHC: 34 g/dL (ref 31.5–35.7)
MCV: 98 fL — ABNORMAL HIGH (ref 79–97)
MONOCYTES: 6 %
Monocytes Absolute: 0.5 10*3/uL (ref 0.1–0.9)
NEUTROS ABS: 4.8 10*3/uL (ref 1.4–7.0)
Neutrophils: 65 %
Platelets: 205 10*3/uL (ref 150–379)
RBC: 4.84 x10E6/uL (ref 4.14–5.80)
RDW: 13.3 % (ref 12.3–15.4)
WBC: 7.5 10*3/uL (ref 3.4–10.8)

## 2016-09-12 LAB — LIPID PANEL W/O CHOL/HDL RATIO
Cholesterol, Total: 204 mg/dL — ABNORMAL HIGH (ref 100–199)
HDL: 50 mg/dL (ref >39)
LDL CALC: 117 mg/dL — AB (ref 0–99)
Triglycerides: 187 mg/dL — ABNORMAL HIGH (ref 0–149)
VLDL CHOLESTEROL CAL: 37 mg/dL (ref 5–40)

## 2016-09-12 LAB — PSA: PROSTATE SPECIFIC AG, SERUM: 0.9 ng/mL (ref 0.0–4.0)

## 2016-09-12 LAB — TSH: TSH: 1.6 u[IU]/mL (ref 0.450–4.500)

## 2016-09-26 DIAGNOSIS — H524 Presbyopia: Secondary | ICD-10-CM | POA: Diagnosis not present

## 2017-01-17 IMAGING — CR DG TOE GREAT 2+V*R*
1 series · 3 of 3 positions shown · non-contrast
Comparison: None.

CLINICAL DATA: Trauma, crush injury to great toe

EXAM:
RIGHT GREAT TOE

[Series 1: dg toe great right · 0.14mm/px · 3 of 3 slices shown]
[im 1/3]
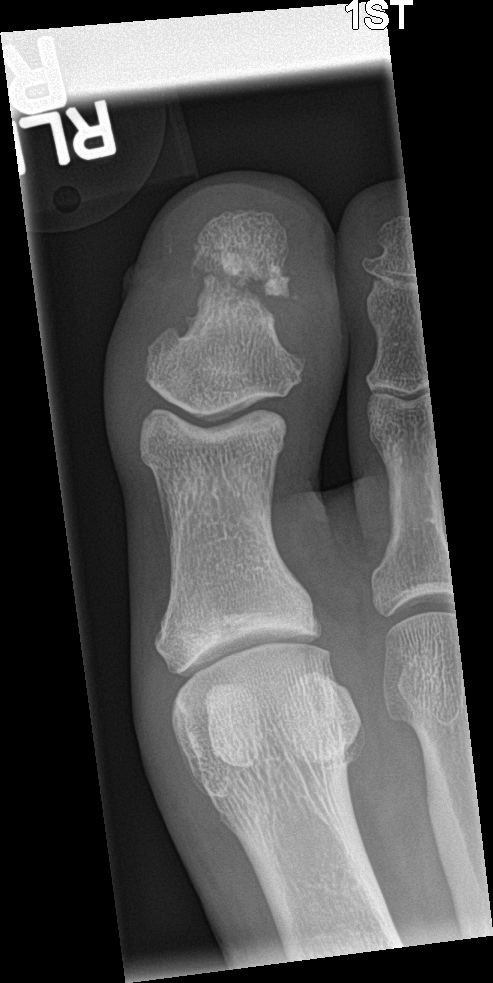
[im 2/3]
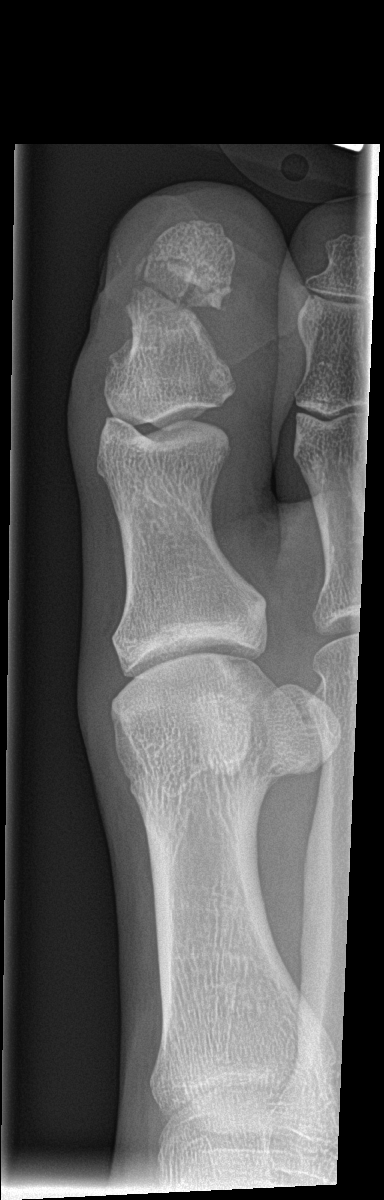
[im 3/3]
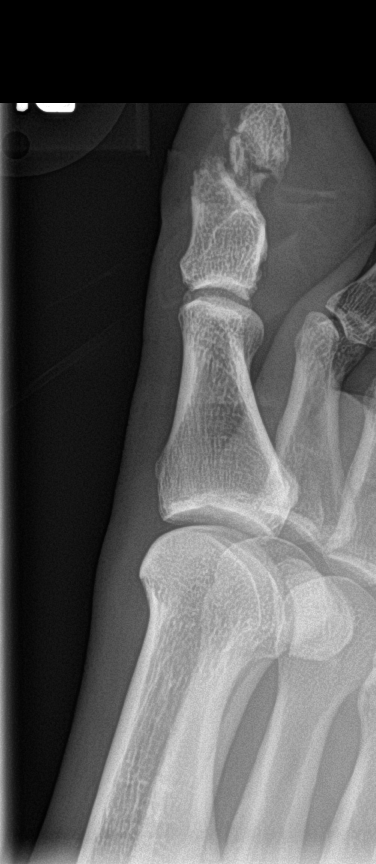

[3 of 3 positions shown; findings below may reference images not displayed]

FINDINGS: Comminuted fracture of the 1st distal phalanx, minimally displaced.

The joint spaces are preserved.

Moderate soft tissue swelling.

No radiopaque foreign body is seen.
IMPRESSION: Comminuted fracture of the 1st distal phalanx.

## 2017-09-18 ENCOUNTER — Ambulatory Visit (INDEPENDENT_AMBULATORY_CARE_PROVIDER_SITE_OTHER): Payer: BLUE CROSS/BLUE SHIELD | Admitting: Family Medicine

## 2017-09-18 ENCOUNTER — Encounter: Payer: Self-pay | Admitting: Family Medicine

## 2017-09-18 ENCOUNTER — Telehealth: Payer: Self-pay | Admitting: Family Medicine

## 2017-09-18 VITALS — BP 161/95 | HR 63 | Temp 98.6°F | Ht 67.1 in | Wt 172.1 lb

## 2017-09-18 DIAGNOSIS — Z125 Encounter for screening for malignant neoplasm of prostate: Secondary | ICD-10-CM | POA: Diagnosis not present

## 2017-09-18 DIAGNOSIS — R030 Elevated blood-pressure reading, without diagnosis of hypertension: Secondary | ICD-10-CM | POA: Diagnosis not present

## 2017-09-18 DIAGNOSIS — M7712 Lateral epicondylitis, left elbow: Secondary | ICD-10-CM

## 2017-09-18 DIAGNOSIS — Z1211 Encounter for screening for malignant neoplasm of colon: Secondary | ICD-10-CM

## 2017-09-18 DIAGNOSIS — Z0000 Encounter for general adult medical examination without abnormal findings: Secondary | ICD-10-CM | POA: Diagnosis not present

## 2017-09-18 LAB — UA/M W/RFLX CULTURE, ROUTINE
Bilirubin, UA: NEGATIVE
Glucose, UA: NEGATIVE
Ketones, UA: NEGATIVE
LEUKOCYTES UA: NEGATIVE
NITRITE UA: NEGATIVE
PH UA: 6.5 (ref 5.0–7.5)
Protein, UA: NEGATIVE
RBC, UA: NEGATIVE
Specific Gravity, UA: 1.005 (ref 1.005–1.030)
Urobilinogen, Ur: 0.2 mg/dL (ref 0.2–1.0)

## 2017-09-18 LAB — MICROALBUMIN, URINE WAIVED
Creatinine, Urine Waived: 50 mg/dL (ref 10–300)
Microalb, Ur Waived: 10 mg/L (ref 0–19)

## 2017-09-18 MED ORDER — NAPROXEN 500 MG PO TABS: 500.0000 mg | ORAL_TABLET | Freq: Two times a day (BID) | ORAL | 0 refills | Status: DC

## 2017-09-18 MED ORDER — SILDENAFIL CITRATE 20 MG PO TABS: ORAL_TABLET | ORAL | 12 refills | Status: DC

## 2017-09-18 NOTE — Progress Notes (Signed)
BP (!) 161/95 (BP Location: Left Arm, Patient Position: Sitting, Cuff Size: Normal)   Pulse 63   Temp 98.6 F (37 C)   Ht 5' 7.1" (1.704 m)   Wt 172 lb 2 oz (78.1 kg)   SpO2 98%   BMI 26.88 kg/m    Subjective:    Patient ID: Philip Atkins, male    DOB: 29-Sep-1956, 61 y.o.   MRN: [id number]  HPI: Philip Atkins is a 61 y.o. male presenting on 09/18/2017 for comprehensive medical examination. Current medical complaints include:  ARM PAIN Duration: 3 months Location: left forearm Mechanism of injury: unknown Onset: gradual Severity: 5/10  Quality:  Tight, crampy Frequency: constant Radiation: yes- radiating up into his armpit Aggravating factors:  nothing Alleviating factors: muscle rub   Status: stable Treatments attempted: muscle rub, rest, ice and heat  Relief with NSAIDs?:  No NSAIDs Taken Swelling: no Redness: no  Warmth: no Trauma: no Chest pain: no  Shortness of breath: no  Fever: no Decreased sensation: no Paresthesias: no Weakness: yes  ELEVATED BLOOD PRESSURE Duration of elevated BP: chronic BP monitoring frequency: a few times a week BP range: 120s-130s Previous BP meds: no Recent stressors: yes Family history of hypertension: yes Recurrent headaches: no Visual changes: no Palpitations: no  Dyspnea: no Chest pain: no Lower extremity edema: no Dizzy/lightheaded: no Transient ischemic attacks: no  He currently lives with: wife Interim Problems from his last visit: no  Depression Screen done today and results listed below:  Depression screen [facility] 2/9 09/18/2017 09/11/2016  Decreased Interest 0 0  Down, Depressed, Hopeless 0 0  PHQ - 2 Score 0 0    Past Medical History:  Past Medical History:  Diagnosis Date  . Abscess of right buttock 11/03/2015  . Abscess of right hip 11/03/2015  . ED (erectile dysfunction)   . Gastric ulceration   . Migraines   . MRSA (methicillin resistant Staphylococcus aureus) June 2016  . Neoplasm of uncertain  behavior   . Neoplasm of uncertain behavior   . Squamous cell carcinoma 06/2013    Surgical History:  Past Surgical History:  Procedure Laterality Date  . COLONOSCOPY  08/08/11   1 benign polyp; one tubular adenoma; repeat 5 years    Medications:  Current Outpatient Prescriptions on File Prior to Visit  Medication Sig  . acetaminophen (TYLENOL) 500 MG tablet Take 500 mg by mouth 2 (two) times daily.  Marland Kitchen aspirin EC 81 MG tablet Take 81 mg by mouth daily.  . Multiple Vitamin (MULTIVITAMIN) tablet Take 1 tablet by mouth daily.   No current facility-administered medications on file prior to visit.     Allergies:  No Known Allergies  Social History:  Social History   Social History  . Marital status: Married    Spouse name: N/A  . Number of children: N/A  . Years of education: N/A   Occupational History  . Not on file.   Social History Main Topics  . Smoking status: Current Every Day Smoker    Packs/day: 0.50    Types: Cigarettes  . Smokeless tobacco: Never Used  . Alcohol use No  . Drug use: No  . Sexual activity: Yes   Other Topics Concern  . Not on file   Social History Narrative  . No narrative on file   History  Smoking Status  . Current Every Day Smoker  . Packs/day: 0.50  . Types: Cigarettes  Smokeless Tobacco  . Never Used   History  Alcohol  Use No    Family History:  Family History  Problem Relation Age of Onset  . Heart attack Mother   . Hypertension Mother   . Heart disease Mother   . Asthma Brother   . Diabetes Brother   . Cancer Neg Hx   . COPD Neg Hx   . Stroke Neg Hx     Past medical history, surgical history, medications, allergies, family history and social history reviewed with patient today and changes made to appropriate areas of the chart.   Review of Systems  Constitutional: Negative.   HENT: Negative.   Eyes: Negative.   Respiratory: Negative.   Cardiovascular: Negative.   Gastrointestinal: Negative.   Genitourinary:  Negative.   Musculoskeletal: Positive for joint pain. Negative for back pain, falls, myalgias and neck pain.  Skin: Negative.   Neurological: Negative.   Endo/Heme/Allergies: Negative for environmental allergies and polydipsia. Bruises/bleeds easily.  Psychiatric/Behavioral: Negative.     All other ROS negative except what is listed above and in the HPI.      Objective:    BP (!) 161/95 (BP Location: Left Arm, Patient Position: Sitting, Cuff Size: Normal)   Pulse 63   Temp 98.6 F (37 C)   Ht 5' 7.1" (1.704 m)   Wt 172 lb 2 oz (78.1 kg)   SpO2 98%   BMI 26.88 kg/m   Wt Readings from Last 3 Encounters:  09/18/17 172 lb 2 oz (78.1 kg)  09/11/16 172 lb 12.5 oz (78.4 kg)  11/02/15 173 lb (78.5 kg)    Physical Exam  Constitutional: He is oriented to person, place, and time. He appears well-developed and well-nourished. No distress.  HENT:  Head: Normocephalic and atraumatic.  Right Ear: Hearing, tympanic membrane, external ear and ear canal normal.  Left Ear: Hearing, tympanic membrane, external ear and ear canal normal.  Nose: Nose normal.  Mouth/Throat: Uvula is midline, oropharynx is clear and moist and mucous membranes are normal. No oropharyngeal exudate.  Eyes: Pupils are equal, round, and reactive to light. Conjunctivae, EOM and lids are normal. Right eye exhibits no discharge. Left eye exhibits no discharge. No scleral icterus.  Neck: Normal range of motion. Neck supple. No JVD present. No tracheal deviation present. No thyromegaly present.  Cardiovascular: Normal rate, regular rhythm, normal heart sounds and intact distal pulses.  Exam reveals no gallop and no friction rub.   No murmur heard. Pulmonary/Chest: Effort normal and breath sounds normal. No stridor. No respiratory distress. He has no wheezes. He has no rales. He exhibits no tenderness.  Abdominal: Soft. Bowel sounds are normal. He exhibits no distension and no mass. There is no tenderness. There is no rebound  and no guarding. Hernia confirmed negative in the right inguinal area and confirmed negative in the left inguinal area.  Genitourinary: Rectum normal, prostate normal, testes normal and penis normal. Prostate is not enlarged and not tender. Cremasteric reflex is present. Right testis shows no mass, no swelling and no tenderness. Right testis is descended. Cremasteric reflex is not absent on the right side. Left testis shows no mass, no swelling and no tenderness. Left testis is descended. Cremasteric reflex is not absent on the left side. Uncircumcised. No phimosis, paraphimosis, hypospadias, penile erythema or penile tenderness. No discharge found.  Musculoskeletal: Normal range of motion. He exhibits tenderness. He exhibits no edema or deformity.  Negative tinel's over carpal and cubital tunnel, Negative phalens, Tenderness over lateral epicondyle on the L  Lymphadenopathy:    He has no  cervical adenopathy.  Neurological: He is alert and oriented to person, place, and time. He has normal reflexes. He displays normal reflexes. No cranial nerve deficit. He exhibits normal muscle tone. Coordination normal.  Skin: Skin is warm, dry and intact. No rash noted. He is not diaphoretic. No erythema. No pallor.  Psychiatric: He has a normal mood and affect. His speech is normal and behavior is normal. Judgment and thought content normal. Cognition and memory are normal.  Nursing note and vitals reviewed.   Results for orders placed or performed in visit on 09/11/16  CBC with Differential/Platelet  Result Value Ref Range   WBC 7.5 3.4 - 10.8 x10E3/uL   RBC 4.84 4.14 - 5.80 x10E6/uL   Hemoglobin 16.1 12.6 - 17.7 g/dL   Hematocrit 47.4 37.5 - 51.0 %   MCV 98 (H) 79 - 97 fL   MCH 33.3 (H) 26.6 - 33.0 pg   MCHC 34.0 31.5 - 35.7 g/dL   RDW 13.3 12.3 - 15.4 %   Platelets 205 150 - 379 x10E3/uL   Neutrophils 65 Not Estab. %   Lymphs 27 Not Estab. %   Monocytes 6 Not Estab. %   Eos 2 Not Estab. %   Basos 0  Not Estab. %   Neutrophils Absolute 4.8 1.4 - 7.0 x10E3/uL   Lymphocytes Absolute 2.1 0.7 - 3.1 x10E3/uL   Monocytes Absolute 0.5 0.1 - 0.9 x10E3/uL   EOS (ABSOLUTE) 0.1 0.0 - 0.4 x10E3/uL   Basophils Absolute 0.0 0.0 - 0.2 x10E3/uL   Immature Granulocytes 0 Not Estab. %   Immature Grans (Abs) 0.0 0.0 - 0.1 x10E3/uL  Comprehensive metabolic panel  Result Value Ref Range   Glucose 92 65 - 99 mg/dL   BUN 15 8 - 27 mg/dL   Creatinine, Ser 0.96 0.76 - 1.27 mg/dL   GFR calc non Af Amer 85 >59 mL/min/1.73   GFR calc Af Amer 98 >59 mL/min/1.73   BUN/Creatinine Ratio 16 10 - 24   Sodium 141 134 - 144 mmol/L   Potassium 4.5 3.5 - 5.2 mmol/L   Chloride 101 96 - 106 mmol/L   CO2 24 18 - 29 mmol/L   Calcium 9.6 8.6 - 10.2 mg/dL   Total Protein 7.1 6.0 - 8.5 g/dL   Albumin 4.7 3.6 - 4.8 g/dL   Globulin, Total 2.4 1.5 - 4.5 g/dL   Albumin/Globulin Ratio 2.0 1.2 - 2.2   Bilirubin Total 0.4 0.0 - 1.2 mg/dL   Alkaline Phosphatase 71 39 - 117 IU/L   AST 21 0 - 40 IU/L   ALT 21 0 - 44 IU/L  Lipid Panel w/o Chol/HDL Ratio  Result Value Ref Range   Cholesterol, Total 204 (H) 100 - 199 mg/dL   Triglycerides 187 (H) 0 - 149 mg/dL   HDL 50 >39 mg/dL   VLDL Cholesterol Cal 37 5 - 40 mg/dL   LDL Calculated 117 (H) 0 - 99 mg/dL  PSA  Result Value Ref Range   Prostate Specific Ag, Serum 0.9 0.0 - 4.0 ng/mL  TSH  Result Value Ref Range   TSH 1.600 0.450 - 4.500 uIU/mL      Assessment & Plan:   Problem List Items Addressed This Visit      Other   Elevated BP without diagnosis of hypertension    Takes it at home and is under good control. Will check microalbumin. Call with any concerns. Work on [employer]. Call with any concerns.  Relevant Orders   Microalbumin, Urine Waived    Other Visit Diagnoses    Routine general medical examination at a health care facility    -  Primary   Vaccines up to date. Screening labs checked today. Colonoscopy ordered. Call with any concerns. Continue  diet and exercise.    Relevant Orders   CBC with Differential/Platelet   Comprehensive metabolic panel   Lipid Panel w/o Chol/HDL Ratio   TSH   UA/M w/rflx Culture, Routine   Screening for colon cancer       Referral to GI made today.   Relevant Orders   Ambulatory referral to Gastroenterology   Screening for prostate cancer       Labs checked today. Await results.   Relevant Orders   PSA   Lateral epicondylitis of left elbow       Brace, naproxen and stretches given today. Call if not getting better or getting worse.    Relevant Medications   naproxen (NAPROSYN) 500 MG tablet      Discussed aspirin prophylaxis for myocardial infarction prevention and decision was made to continue ASA  LABORATORY TESTING:  Health maintenance labs ordered today as discussed above.   The natural history of prostate cancer and ongoing controversy regarding screening and potential treatment outcomes of prostate cancer has been discussed with the patient. The meaning of a false positive PSA and a false negative PSA has been discussed. He indicates understanding of the limitations of this screening test and wishes to proceed with screening PSA testing.   IMMUNIZATIONS:   - Tdap: Tetanus vaccination status reviewed: last tetanus booster within 10 years. - Influenza: Up to date - Pneumovax: Refused   SCREENING: - Colonoscopy: Up to date  Discussed with patient purpose of the colonoscopy is to detect colon cancer at curable precancerous or early stages    PATIENT COUNSELING:    Sexuality: Discussed sexually transmitted diseases, partner selection, use of condoms, avoidance of unintended pregnancy  and contraceptive alternatives.   Advised to avoid cigarette smoking.  I discussed with the patient that most people either abstain from alcohol or drink within safe limits (<=14/week and <=4 drinks/occasion for males, <=7/weeks and <= 3 drinks/occasion for females) and that the risk for alcohol  disorders and other health effects rises proportionally with the number of drinks per week and how often a drinker exceeds daily limits.  Discussed cessation/primary prevention of drug use and availability of treatment for abuse.   Diet: Encouraged to adjust caloric intake to maintain  or achieve ideal body weight, to reduce intake of dietary saturated fat and total fat, to limit sodium intake by avoiding high sodium foods and not adding table salt, and to maintain adequate dietary potassium and calcium preferably from fresh fruits, vegetables, and low-fat dairy products.    stressed the importance of regular exercise  Injury prevention: Discussed safety belts, safety helmets, smoke detector, smoking near bedding or upholstery.   Dental health: Discussed importance of regular tooth brushing, flossing, and dental visits.   Follow up plan: NEXT PREVENTATIVE PHYSICAL DUE IN 1 YEAR. Return in about 1 year (around 09/18/2018) for Physical.

## 2017-09-18 NOTE — Telephone Encounter (Signed)
Please re-route. He is not a patient at [facility]

## 2017-09-18 NOTE — Telephone Encounter (Signed)
[facility] skyped this Theodoro Koval MRN [id number] DOB January 22, 2056 at pharmacy to pick up rx noted it was printed in the office do ou want [place] to phone it in

## 2017-09-18 NOTE — Assessment & Plan Note (Signed)
Takes it at home and is under good control. Will check microalbumin. Call with any concerns. Work on [employer]. Call with any concerns.

## 2017-09-18 NOTE — Patient Instructions (Addendum)
Health Maintenance, Male A healthy lifestyle and preventive care is important for your health and wellness. Ask your health care provider about what schedule of regular examinations is right for you. What should I know about weight and diet? Eat a Healthy Diet  Eat plenty of vegetables, fruits, whole grains, low-fat dairy products, and lean protein.  Do not eat a lot of foods high in solid fats, added sugars, or salt.  Maintain a Healthy Weight Regular exercise can help you achieve or maintain a healthy weight. You should:  Do at least 150 minutes of exercise each week. The exercise should increase your heart rate and make you sweat (moderate-intensity exercise).  Do strength-training exercises at least twice a week.  Watch Your Levels of Cholesterol and Blood Lipids  Have your blood tested for lipids and cholesterol every 5 years starting at 62 years of age. If you are at high risk for heart disease, you should start having your blood tested when you are 62 years old. You may need to have your cholesterol levels checked more often if: ? Your lipid or cholesterol levels are high. ? You are older than 62 years of age. ? You are at high risk for heart disease.  What should I know about cancer screening? Many types of cancers can be detected early and may often be prevented. Lung Cancer  You should be screened every year for lung cancer if: ? You are a current smoker who has smoked for at least 30 years. ? You are a former smoker who has quit within the past 15 years.  Talk to your health care provider about your screening options, when you should start screening, and how often you should be screened.  Colorectal Cancer  Routine colorectal cancer screening usually begins at 62 years of age and should be repeated every 5-10 years until you are 62 years old. You may need to be screened more often if early forms of precancerous polyps or small growths are found. Your health care provider  may recommend screening at an earlier age if you have risk factors for colon cancer.  Your health care provider may recommend using home test kits to check for hidden blood in the stool.  A small camera at the end of a tube can be used to examine your colon (sigmoidoscopy or colonoscopy). This checks for the earliest forms of colorectal cancer.  Prostate and Testicular Cancer  Depending on your age and overall health, your health care provider may do certain tests to screen for prostate and testicular cancer.  Talk to your health care provider about any symptoms or concerns you have about testicular or prostate cancer.  Skin Cancer  Check your skin from head to toe regularly.  Tell your health care provider about any new moles or changes in moles, especially if: ? There is a change in a mole's size, shape, or color. ? You have a mole that is larger than a pencil eraser.  Always use sunscreen. Apply sunscreen liberally and repeat throughout the day.  Protect yourself by wearing long sleeves, pants, a wide-brimmed hat, and sunglasses when outside.  What should I know about heart disease, diabetes, and high blood pressure?  If you are 18-39 years of age, have your blood pressure checked every 3-5 years. If you are 40 years of age or older, have your blood pressure checked every year. You should have your blood pressure measured twice-once when you are at a hospital or clinic, and once when   you are not at a hospital or clinic. Record the average of the two measurements. To check your blood pressure when you are not at a hospital or clinic, you can use: ? An automated blood pressure machine at a pharmacy. ? A home blood pressure monitor.  Talk to your health care provider about your target blood pressure.  If you are between 16-59 years old, ask your health care provider if you should take aspirin to prevent heart disease.  Have regular diabetes screenings by checking your fasting blood  sugar level. ? If you are at a normal weight and have a low risk for diabetes, have this test once every three years after the age of 43. ? If you are overweight and have a high risk for diabetes, consider being tested at a younger age or more often.  A one-time screening for abdominal aortic aneurysm (AAA) by ultrasound is recommended for men aged 61-75 years who are current or former smokers. What should I know about preventing infection? Hepatitis B If you have a higher risk for hepatitis B, you should be screened for this virus. Talk with your health care provider to find out if you are at risk for hepatitis B infection. Hepatitis C Blood testing is recommended for:  Everyone born from [birth year] through 1966.  Anyone with known risk factors for hepatitis C.  Sexually Transmitted Diseases (STDs)  You should be screened each year for STDs including gonorrhea and chlamydia if: ? You are sexually active and are younger than 62 years of age. ? You are older than 62 years of age and your health care provider tells you that you are at risk for this type of infection. ? Your sexual activity has changed since you were last screened and you are at an increased risk for chlamydia or gonorrhea. Ask your health care provider if you are at risk.  Talk with your health care provider about whether you are at high risk of being infected with HIV. Your health care provider may recommend a prescription medicine to help prevent HIV infection.  What else can I do?  Schedule regular health, dental, and eye exams.  Stay current with your vaccines (immunizations).  Do not use any tobacco products, such as cigarettes, chewing tobacco, and e-cigarettes. If you need help quitting, ask your health care provider.  Limit alcohol intake to no more than 2 drinks per day. One drink equals 12 ounces of beer, 5 ounces of wine, or 1 ounces of hard liquor.  Do not use street drugs.  Do not share needles.  Ask your  health care provider for help if you need support or information about quitting drugs.  Tell your health care provider if you often feel depressed.  Tell your health care provider if you have ever been abused or do not feel safe at home. This information is not intended to replace advice given to you by your health care provider. Make sure you discuss any questions you have with your health care provider. Document Released: 05/01/2008 Document Revised: 07/02/2016 Document Reviewed: 08/07/2015 Elsevier Interactive Patient Education  2018 [place] Eating Plan DASH stands for "Dietary Approaches to Stop Hypertension." The DASH eating plan is a healthy eating plan that has been shown to reduce high blood pressure (hypertension). It may also reduce your risk for type 2 diabetes, heart disease, and stroke. The DASH eating plan may also help with weight loss. What are tips for following this plan? General guidelines  Avoid eating more than 2,300 mg (milligrams) of salt (sodium) a day. If you have hypertension, you may need to reduce your sodium intake to 1,500 mg a day.  Limit alcohol intake to no more than 1 drink a day for nonpregnant women and 2 drinks a day for men. One drink equals 12 oz of beer, 5 oz of wine, or 1 oz of hard liquor.  Work with your health care provider to maintain a healthy body weight or to lose weight. Ask what an ideal weight is for you.  Get at least 30 minutes of exercise that causes your heart to beat faster (aerobic exercise) most days of the week. Activities may include walking, swimming, or biking.  Work with your health care provider or diet and nutrition specialist (dietitian) to adjust your eating plan to your individual calorie needs. Reading food labels  Check food labels for the amount of sodium per serving. Choose foods with less than 5 percent of the Daily Value of sodium. Generally, foods with less than 300 mg of sodium per serving fit into this  eating plan.  To find whole grains, look for the word "whole" as the first word in the ingredient list. Shopping  Buy products labeled as "low-sodium" or "no salt added."  Buy fresh foods. Avoid canned foods and premade or frozen meals. Cooking  Avoid adding salt when cooking. Use salt-free seasonings or herbs instead of table salt or sea salt. Check with your health care provider or pharmacist before using salt substitutes.  Do not fry foods. Cook foods using healthy methods such as baking, boiling, grilling, and broiling instead.  Cook with heart-healthy oils, such as olive, canola, soybean, or sunflower oil. Meal planning   Eat a balanced diet that includes: ? 5 or more servings of fruits and vegetables each day. At each meal, try to fill half of your plate with fruits and vegetables. ? Up to 6-8 servings of whole grains each day. ? Less than 6 oz of lean meat, poultry, or fish each day. A 3-oz serving of meat is about the same size as a deck of cards. One egg equals 1 oz. ? 2 servings of low-fat dairy each day. ? A serving of nuts, seeds, or beans 5 times each week. ? Heart-healthy fats. Healthy fats called Omega-3 fatty acids are found in foods such as flaxseeds and coldwater fish, like sardines, salmon, and mackerel.  Limit how much you eat of the following: ? Canned or prepackaged foods. ? Food that is high in trans fat, such as fried foods. ? Food that is high in saturated fat, such as fatty meat. ? Sweets, desserts, sugary drinks, and other foods with added sugar. ? Full-fat dairy products.  Do not salt foods before eating.  Try to eat at least 2 vegetarian meals each week.  Eat more home-cooked food and less restaurant, buffet, and fast food.  When eating at a restaurant, ask that your food be prepared with less salt or no salt, if possible. What foods are recommended? The items listed may not be a complete list. Talk with your dietitian about what dietary choices  are best for you. Grains Whole-grain or whole-wheat bread. Whole-grain or whole-wheat pasta. Brown rice. Oatmeal. Quinoa. Bulgur. Whole-grain and low-sodium cereals. Pita bread. Low-fat, low-sodium crackers. Whole-wheat flour tortillas. Vegetables Fresh or frozen vegetables (raw, steamed, roasted, or grilled). Low-sodium or reduced-sodium tomato and vegetable juice. Low-sodium or reduced-sodium tomato sauce and tomato paste. Low-sodium or reduced-sodium canned vegetables. Fruits   All fresh, dried, or frozen fruit. Canned fruit in natural juice (without added sugar). Meat and other protein foods Skinless chicken or [place]. Ground chicken or [place]. Pork with fat trimmed off. Fish and seafood. Egg whites. Dried beans, peas, or lentils. Unsalted nuts, nut butters, and seeds. Unsalted canned beans. Lean cuts of beef with fat trimmed off. Low-sodium, lean deli meat. Dairy Low-fat (1%) or fat-free (skim) milk. Fat-free, low-fat, or reduced-fat cheeses. Nonfat, low-sodium ricotta or cottage cheese. Low-fat or nonfat yogurt. Low-fat, low-sodium cheese. Fats and oils Soft margarine without trans fats. Vegetable oil. Low-fat, reduced-fat, or light mayonnaise and salad dressings (reduced-sodium). Canola, safflower, olive, soybean, and sunflower oils. Avocado. Seasoning and other foods Herbs. Spices. Seasoning mixes without salt. Unsalted popcorn and pretzels. Fat-free sweets. What foods are not recommended? The items listed may not be a complete list. Talk with your dietitian about what dietary choices are best for you. Grains Baked goods made with fat, such as croissants, muffins, or some breads. Dry pasta or rice meal packs. Vegetables Creamed or fried vegetables. Vegetables in a cheese sauce. Regular canned vegetables (not low-sodium or reduced-sodium). Regular canned tomato sauce and paste (not low-sodium or reduced-sodium). Regular tomato and vegetable juice (not low-sodium or reduced-sodium). Philip Atkins.  Olives. Fruits Canned fruit in a light or heavy syrup. Fried fruit. Fruit in cream or butter sauce. Meat and other protein foods Fatty cuts of meat. Ribs. Fried meat. Philip Atkins. Sausage. Bologna and other processed lunch meats. Salami. Fatback. Hotdogs. Bratwurst. Salted nuts and seeds. Canned beans with added salt. Canned or smoked fish. Whole eggs or egg yolks. Chicken or [place] with skin. Dairy Whole or 2% milk, cream, and half-and-half. Whole or full-fat cream cheese. Whole-fat or sweetened yogurt. Full-fat cheese. Nondairy creamers. Whipped toppings. Processed cheese and cheese spreads. Fats and oils Butter. Stick margarine. Lard. Shortening. Ghee. Bacon fat. Tropical oils, such as coconut, palm kernel, or palm oil. Seasoning and other foods Salted popcorn and pretzels. Onion salt, garlic salt, seasoned salt, table salt, and sea salt. Worcestershire sauce. Tartar sauce. Barbecue sauce. Teriyaki sauce. Soy sauce, including reduced-sodium. Steak sauce. Canned and packaged gravies. Fish sauce. Oyster sauce. Cocktail sauce. Horseradish that you find on the shelf. Ketchup. Mustard. Meat flavorings and tenderizers. Bouillon cubes. Hot sauce and Tabasco sauce. Premade or packaged marinades. Premade or packaged taco seasonings. Relishes. Regular salad dressings. Where to find more information:  National Heart, Lung, and [place]: https://wilson-eaton.com/  American Heart Association: www.heart.org Summary  The DASH eating plan is a healthy eating plan that has been shown to reduce high blood pressure (hypertension). It may also reduce your risk for type 2 diabetes, heart disease, and stroke.  With the DASH eating plan, you should limit salt (sodium) intake to 2,300 mg a day. If you have hypertension, you may need to reduce your sodium intake to 1,500 mg a day.  When on the DASH eating plan, aim to eat more fresh fruits and vegetables, whole grains, lean proteins, low-fat dairy, and heart-healthy  fats.  Work with your health care provider or diet and nutrition specialist (dietitian) to adjust your eating plan to your individual calorie needs. This information is not intended to replace advice given to you by your health care provider. Make sure you discuss any questions you have with your health care provider. Document Released: 10/23/2011 Document Revised: 10/27/2016 Document Reviewed: 10/27/2016 Elsevier Interactive Patient Education  2017 [place] Ask your health care provider which exercises are safe for you.  Do exercises exactly as told by your health care provider and adjust them as directed. It is normal to feel mild stretching, pulling, tightness, or discomfort as you do these exercises, but you should stop right away if you feel sudden pain or your pain gets worse. Do not begin these exercises until told by your health care provider. Stretching and range of motion exercises These exercises warm up your muscles and joints and improve the movement and flexibility of your elbow. These exercises also help to relieve pain, numbness, and tingling. Exercise A: Wrist extensor stretch 1. Extend your left / right elbow with your fingers pointing down. 2. Gently pull the palm of your left / right hand toward you until you feel a gentle stretch on the top of your forearm. 3. To increase the stretch, push your left / right hand toward the outer edge or pinkie side of your forearm. 4. Hold this position for __________ seconds. Repeat __________ times. Complete this exercise __________ times a day. If directed by your health care provider, repeat this stretch except do it with a bent elbow this time. Exercise B: Wrist flexor stretch  1. Extend your left / right elbow and turn your palm upward. 2. Gently pull your left / right palm and fingertips back so your wrist extends and your fingers point more toward the ground. 3. You should feel a gentle stretch on the inside  of your forearm. 4. Hold this position for __________ seconds. Repeat __________ times. Complete this exercise __________ times a day. If directed by your health care provider, repeat this stretch except do it with a bent elbow this time. Strengthening exercises These exercises build strength and endurance in your elbow. Endurance is the ability to use your muscles for a long time, even after they get tired. Exercise C: Wrist extensors  1. Sit with your left / right forearm palm-down and fully supported on a table or countertop. Your elbow should be resting below the height of your shoulder. 2. Let your left / right wrist extend over the edge of the surface. 3. Loosely hold a __________ weight or a piece of rubber exercise band or tubing in your left / right hand. Slowly curl your left / right hand up toward your forearm. If you are using band or tubing, hold the band or tubing in place with your other hand to provide resistance. 4. Hold this position for __________ seconds. 5. Slowly return to the starting position. Repeat __________ times. Complete this exercise __________ times a day. Exercise D: Radial deviators  1. Stand with a __________ weight in your left / righthand. Or, sit while holding a rubber exercise band or tubing with your other arm supported on a table or countertop. Position your hand so your thumb is on top. 2. Raise your hand upward in front of you so your thumb travels toward your forearm, or pull up on the rubber tubing. 3. Hold this position for __________ seconds. 4. Slowly return to the starting position. Repeat __________ times. Complete this exercise __________ times a day. Exercise E: Eccentric wrist extensors 1. Sit with your left / right forearm palm-down and fully supported on a table or countertop. Your elbow should be resting below the height of your shoulder. 2. If told by your health care provider, hold a __________ weight in your hand. 3. Let your left /  right wrist extend over the edge of the surface. 4. Use your other hand to lift up your left /  right hand toward your forearm. Keep your forearm on the table. 5. Using only the muscles in your left / right hand, slowly lower your hand back down to the starting position. Repeat __________ times. Complete this exercise __________ times a day. This information is not intended to replace advice given to you by your health care provider. Make sure you discuss any questions you have with your health care provider. Document Released: 11/03/2005 Document Revised: 07/09/2016 Document Reviewed: 08/02/2015 Elsevier Interactive Patient Education  [employer].

## 2017-09-18 NOTE — Telephone Encounter (Signed)
Copied from [place] #[id number]. Topic: Quick Communication - See Telephone Encounter >> Sep 18, 2017  4:32 PM Vernona Rieger wrote: CRM for notification. See Telephone encounter for:  09/18/17. Patient just said he left the office and the dr was suppose to send over a script for SILDENAFIL. The pharmacy said they do not have it. Can someone please send it over asap. thanks

## 2017-09-19 LAB — CBC WITH DIFFERENTIAL/PLATELET
BASOS: 0 %
Basophils Absolute: 0 10*3/uL (ref 0.0–0.2)
EOS (ABSOLUTE): 0.1 10*3/uL (ref 0.0–0.4)
EOS: 2 %
Hematocrit: 47.5 % (ref 37.5–51.0)
Hemoglobin: 16.1 g/dL (ref 13.0–17.7)
IMMATURE GRANS (ABS): 0 10*3/uL (ref 0.0–0.1)
IMMATURE GRANULOCYTES: 0 %
Lymphocytes Absolute: 2.4 10*3/uL (ref 0.7–3.1)
Lymphs: 32 %
MCH: 32.8 pg (ref 26.6–33.0)
MCHC: 33.9 g/dL (ref 31.5–35.7)
MCV: 97 fL (ref 79–97)
MONOS ABS: 0.4 10*3/uL (ref 0.1–0.9)
Monocytes: 5 %
Neutrophils Absolute: 4.6 10*3/uL (ref 1.4–7.0)
Neutrophils: 61 %
PLATELETS: 219 10*3/uL (ref 150–379)
RBC: 4.91 x10E6/uL (ref 4.14–5.80)
RDW: 13.3 % (ref 12.3–15.4)
WBC: 7.6 10*3/uL (ref 3.4–10.8)

## 2017-09-19 LAB — LIPID PANEL W/O CHOL/HDL RATIO
Cholesterol, Total: 224 mg/dL — ABNORMAL HIGH (ref 100–199)
HDL: 52 mg/dL (ref >39)
LDL CALC: 123 mg/dL — AB (ref 0–99)
Triglycerides: 243 mg/dL — ABNORMAL HIGH (ref 0–149)
VLDL CHOLESTEROL CAL: 49 mg/dL — AB (ref 5–40)

## 2017-09-19 LAB — COMPREHENSIVE METABOLIC PANEL
ALT: 24 IU/L (ref 0–44)
AST: 24 IU/L (ref 0–40)
Albumin/Globulin Ratio: 2 (ref 1.2–2.2)
Albumin: 4.9 g/dL — ABNORMAL HIGH (ref 3.6–4.8)
Alkaline Phosphatase: 71 IU/L (ref 39–117)
BUN/Creatinine Ratio: 13 (ref 10–24)
BUN: 13 mg/dL (ref 8–27)
Bilirubin Total: 0.4 mg/dL (ref 0.0–1.2)
CALCIUM: 10.1 mg/dL (ref 8.6–10.2)
CO2: 26 mmol/L (ref 20–29)
CREATININE: 1.03 mg/dL (ref 0.76–1.27)
Chloride: 104 mmol/L (ref 96–106)
GFR calc Af Amer: 90 mL/min/{1.73_m2} (ref >59)
GFR, EST NON AFRICAN AMERICAN: 77 mL/min/{1.73_m2} (ref >59)
Globulin, Total: 2.5 g/dL (ref 1.5–4.5)
Glucose: 87 mg/dL (ref 65–99)
Potassium: 5 mmol/L (ref 3.5–5.2)
Sodium: 145 mmol/L — ABNORMAL HIGH (ref 134–144)
TOTAL PROTEIN: 7.4 g/dL (ref 6.0–8.5)

## 2017-09-19 LAB — PSA: PROSTATE SPECIFIC AG, SERUM: 1 ng/mL (ref 0.0–4.0)

## 2017-09-19 LAB — TSH: TSH: 1.8 u[IU]/mL (ref 0.450–4.500)

## 2017-09-21 ENCOUNTER — Encounter: Payer: Self-pay | Admitting: Family Medicine

## 2017-09-22 ENCOUNTER — Other Ambulatory Visit: Payer: Self-pay | Admitting: Family Medicine

## 2017-09-22 MED ORDER — SILDENAFIL CITRATE 20 MG PO TABS: ORAL_TABLET | ORAL | 12 refills | Status: DC

## 2017-11-19 ENCOUNTER — Telehealth: Payer: Self-pay | Admitting: Family Medicine

## 2017-11-19 MED ORDER — LISINOPRIL 10 MG PO TABS: 10.0000 mg | ORAL_TABLET | Freq: Every day | ORAL | 3 refills | Status: DC

## 2017-11-19 NOTE — Telephone Encounter (Signed)
Spoke with pt. And he decided he would like to start the lisinopril. Thanks.

## 2017-11-19 NOTE — Telephone Encounter (Signed)
Patient notified and appointment scheduled. 

## 2017-11-19 NOTE — Telephone Encounter (Signed)
Rx sent to his pharmacy. Please have him follow up in 1 month for recheck on BP. Thanks!

## 2017-11-19 NOTE — Telephone Encounter (Signed)
Routing to provider  

## 2017-11-19 NOTE — Telephone Encounter (Signed)
Copied from [place] [id number]. Topic: Quick Communication - See Telephone Encounter >> Nov 19, 2017 11:08 AM Hewitt Shorts wrote: CRM for notification. See Telephone encounter for: pt states that at his last visit with Wynetta Emery they talked about him being put on lisinopril but he declined but now he would like a rx to be called in for him   Best number  [phone]   11/19/17.

## 2017-11-20 ENCOUNTER — Telehealth: Payer: Self-pay | Admitting: Gastroenterology

## 2017-11-20 ENCOUNTER — Other Ambulatory Visit: Payer: Self-pay

## 2017-11-20 DIAGNOSIS — Z1211 Encounter for screening for malignant neoplasm of colon: Secondary | ICD-10-CM

## 2017-11-20 NOTE — Telephone Encounter (Signed)
Gastroenterology Pre-Procedure Review  Request Date:  Requesting Physician: Dr.   PATIENT REVIEW QUESTIONS: The patient responded to the following health history questions as indicated:    1. Are you having any GI issues? no 2. Do you have a personal history of Polyps? yes (5 years ago) 3. Do you have a family history of Colon Cancer or Polyps? no 4. Diabetes Mellitus? no 5. Joint replacements in the past 12 months?no 6. Major health problems in the past 3 months?no 7. Any artificial heart valves, MVP, or defibrillator?no    MEDICATIONS & ALLERGIES:    Patient reports the following regarding taking any anticoagulation/antiplatelet therapy:   Plavix, Coumadin, Eliquis, Xarelto, Lovenox, Pradaxa, Brilinta, or Effient? no Aspirin? yes (ASA 81 mg)  Patient confirms/reports the following medications:  Current Outpatient Medications  Medication Sig Dispense Refill   acetaminophen (TYLENOL) 500 MG tablet Take 500 mg by mouth 2 (two) times daily.     aspirin EC 81 MG tablet Take 81 mg by mouth daily.     lisinopril (PRINIVIL,ZESTRIL) 10 MG tablet Take 1 tablet (10 mg total) by mouth daily. 30 tablet 3   Multiple Vitamin (MULTIVITAMIN) tablet Take 1 tablet by mouth daily.     naproxen (NAPROSYN) 500 MG tablet Take 1 tablet (500 mg total) by mouth 2 (two) times daily with a meal. 30 tablet 0   sildenafil (REVATIO) 20 MG tablet One or two pills by mouth daily prior to intimacy; 30 tablet 12   No current facility-administered medications for this visit.     Patient confirms/reports the following allergies:  No Known Allergies  No orders of the defined types were placed in this encounter.   AUTHORIZATION INFORMATION Primary Insurance: 1D#: Group #:  Secondary Insurance: 1D#: Group #:  SCHEDULE INFORMATION: Date:  Time: Location:

## 2017-12-02 ENCOUNTER — Encounter: Payer: Self-pay | Admitting: Family Medicine

## 2017-12-02 ENCOUNTER — Ambulatory Visit (INDEPENDENT_AMBULATORY_CARE_PROVIDER_SITE_OTHER): Payer: BLUE CROSS/BLUE SHIELD | Admitting: Family Medicine

## 2017-12-02 VITALS — BP 149/90 | HR 60 | Temp 98.3°F | Wt 178.3 lb

## 2017-12-02 DIAGNOSIS — J0100 Acute maxillary sinusitis, unspecified: Secondary | ICD-10-CM | POA: Diagnosis not present

## 2017-12-02 MED ORDER — AMOXICILLIN-POT CLAVULANATE 875-125 MG PO TABS: 1.0000 | ORAL_TABLET | Freq: Two times a day (BID) | ORAL | 0 refills | Status: DC

## 2017-12-02 NOTE — Progress Notes (Signed)
BP (!) 149/90 (BP Location: Left Arm, Patient Position: Sitting, Cuff Size: Normal)   Pulse 60   Temp 98.3 F (36.8 C)   Wt 178 lb 5 oz (80.9 kg)   SpO2 98%   BMI 27.84 kg/m    Subjective:    Patient ID: Philip Atkins, male    DOB: [birth year], 62 y.o.   MRN: [id number]  HPI: Philip Atkins is a 62 y.o. male  Chief Complaint  Patient presents with  . URI    sinus pressure left side of face x 3 weeks   UPPER RESPIRATORY TRACT INFECTION Duration: 3 weeks Worst symptom: pain in the face Fever: no Cough: no Shortness of breath: no Wheezing: no Chest pain: no Chest tightness: no Chest congestion: no Nasal congestion: yes Runny nose: no Post nasal drip: no Sneezing: no Sore throat: no Swollen glands: no Sinus pressure: no Headache: yes Face pain: yes Toothache: yes Ear pain: no  Ear pressure: no  Eyes red/itching:no Eye drainage/crusting: no  Vomiting: no Rash: no Fatigue: yes Sick contacts: yes Strep contacts: no  Context: worse Recurrent sinusitis: no Relief with OTC cold/cough medications: no  Treatments attempted: cold/sinus and mucinex   Relevant past medical, surgical, family and social history reviewed and updated as indicated. Interim medical history since our last visit reviewed. Allergies and medications reviewed and updated.  Review of Systems  Constitutional: Negative.   HENT: Positive for congestion, sinus pressure and sinus pain. Negative for dental problem, drooling, ear discharge, ear pain, facial swelling, hearing loss, mouth sores, nosebleeds, postnasal drip, rhinorrhea, sneezing, sore throat, tinnitus, trouble swallowing and voice change.   Respiratory: Negative.   Cardiovascular: Negative.   Psychiatric/Behavioral: Negative.     Per HPI unless specifically indicated above     Objective:    BP (!) 149/90 (BP Location: Left Arm, Patient Position: Sitting, Cuff Size: Normal)   Pulse 60   Temp 98.3 F (36.8 C)   Wt 178 lb 5 oz  (80.9 kg)   SpO2 98%   BMI 27.84 kg/m   Wt Readings from Last 3 Encounters:  12/02/17 178 lb 5 oz (80.9 kg)  09/18/17 172 lb 2 oz (78.1 kg)  09/11/16 172 lb 12.5 oz (78.4 kg)    Physical Exam  Constitutional: He is oriented to person, place, and time. He appears well-developed and well-nourished. No distress.  HENT:  Head: Normocephalic and atraumatic.  Right Ear: Hearing, tympanic membrane, external ear and ear canal normal.  Left Ear: Hearing, tympanic membrane, external ear and ear canal normal.  Nose: Mucosal edema and rhinorrhea present. Right sinus exhibits no maxillary sinus tenderness and no frontal sinus tenderness. Left sinus exhibits maxillary sinus tenderness. Left sinus exhibits no frontal sinus tenderness.  Mouth/Throat: Uvula is midline, oropharynx is clear and moist and mucous membranes are normal. No oropharyngeal exudate.  Eyes: Conjunctivae, EOM and lids are normal. Pupils are equal, round, and reactive to light. Right eye exhibits no discharge. Left eye exhibits no discharge. No scleral icterus.  Neck: Normal range of motion. Neck supple. No JVD present. No tracheal deviation present. No thyromegaly present.  Cardiovascular: Normal rate, regular rhythm, normal heart sounds and intact distal pulses. Exam reveals no gallop and no friction rub.  No murmur heard. Pulmonary/Chest: Effort normal and breath sounds normal. No stridor. No respiratory distress. He has no wheezes. He has no rales. He exhibits no tenderness.  Musculoskeletal: Normal range of motion.  Lymphadenopathy:    He has no cervical adenopathy.  Neurological:  He is alert and oriented to person, place, and time.  Skin: Skin is warm, dry and intact. No rash noted. He is not diaphoretic. No erythema. No pallor.  Psychiatric: He has a normal mood and affect. His speech is normal and behavior is normal. Judgment and thought content normal. Cognition and memory are normal.  Nursing note and vitals  reviewed.   Results for orders placed or performed in visit on 09/18/17  CBC with Differential/Platelet  Result Value Ref Range   WBC 7.6 3.4 - 10.8 x10E3/uL   RBC 4.91 4.14 - 5.80 x10E6/uL   Hemoglobin 16.1 13.0 - 17.7 g/dL   Hematocrit 47.5 37.5 - 51.0 %   MCV 97 79 - 97 fL   MCH 32.8 26.6 - 33.0 pg   MCHC 33.9 31.5 - 35.7 g/dL   RDW 13.3 12.3 - 15.4 %   Platelets 219 150 - 379 x10E3/uL   Neutrophils 61 Not Estab. %   Lymphs 32 Not Estab. %   Monocytes 5 Not Estab. %   Eos 2 Not Estab. %   Basos 0 Not Estab. %   Neutrophils Absolute 4.6 1.4 - 7.0 x10E3/uL   Lymphocytes Absolute 2.4 0.7 - 3.1 x10E3/uL   Monocytes Absolute 0.4 0.1 - 0.9 x10E3/uL   EOS (ABSOLUTE) 0.1 0.0 - 0.4 x10E3/uL   Basophils Absolute 0.0 0.0 - 0.2 x10E3/uL   Immature Granulocytes 0 Not Estab. %   Immature Grans (Abs) 0.0 0.0 - 0.1 x10E3/uL  Comprehensive metabolic panel  Result Value Ref Range   Glucose 87 65 - 99 mg/dL   BUN 13 8 - 27 mg/dL   Creatinine, Ser 1.03 0.76 - 1.27 mg/dL   GFR calc non Af Amer 77 >59 mL/min/1.73   GFR calc Af Amer 90 >59 mL/min/1.73   BUN/Creatinine Ratio 13 10 - 24   Sodium 145 (H) 134 - 144 mmol/L   Potassium 5.0 3.5 - 5.2 mmol/L   Chloride 104 96 - 106 mmol/L   CO2 26 20 - 29 mmol/L   Calcium 10.1 8.6 - 10.2 mg/dL   Total Protein 7.4 6.0 - 8.5 g/dL   Albumin 4.9 (H) 3.6 - 4.8 g/dL   Globulin, Total 2.5 1.5 - 4.5 g/dL   Albumin/Globulin Ratio 2.0 1.2 - 2.2   Bilirubin Total 0.4 0.0 - 1.2 mg/dL   Alkaline Phosphatase 71 39 - 117 IU/L   AST 24 0 - 40 IU/L   ALT 24 0 - 44 IU/L  Lipid Panel w/o Chol/HDL Ratio  Result Value Ref Range   Cholesterol, Total 224 (H) 100 - 199 mg/dL   Triglycerides 243 (H) 0 - 149 mg/dL   HDL 52 >39 mg/dL   VLDL Cholesterol Cal 49 (H) 5 - 40 mg/dL   LDL Calculated 123 (H) 0 - 99 mg/dL  PSA  Result Value Ref Range   Prostate Specific Ag, Serum 1.0 0.0 - 4.0 ng/mL  TSH  Result Value Ref Range   TSH 1.800 0.450 - 4.500 uIU/mL  UA/M  w/rflx Culture, Routine  Result Value Ref Range   Specific Gravity, UA 1.005 1.005 - 1.030   pH, UA 6.5 5.0 - 7.5   Color, UA Yellow Yellow   Appearance Ur Clear Clear   Leukocytes, UA Negative Negative   Protein, UA Negative Negative/Trace   Glucose, UA Negative Negative   Ketones, UA Negative Negative   RBC, UA Negative Negative   Bilirubin, UA Negative Negative   Urobilinogen, Ur 0.2 0.2 - 1.0 mg/dL  Nitrite, UA Negative Negative  Microalbumin, Urine Waived  Result Value Ref Range   Microalb, Ur Waived 10 0 - 19 mg/L   Creatinine, Urine Waived 50 10 - 300 mg/dL   Microalb/Creat Ratio 30-300 (H) <30 mg/g      Assessment & Plan:   Problem List Items Addressed This Visit    None    Visit Diagnoses    Acute non-recurrent maxillary sinusitis    -  Primary   Will treat with augmentin. Call with any concerns. Continue to monitor.    Relevant Medications   amoxicillin-clavulanate (AUGMENTIN) 875-125 MG tablet       Follow up plan: Return if symptoms worsen or fail to improve.

## 2017-12-07 ENCOUNTER — Telehealth: Payer: Self-pay | Admitting: Gastroenterology

## 2017-12-07 NOTE — Telephone Encounter (Signed)
Colonoscopy cancelled with [facility] Endo due to pt having the flu.

## 2017-12-07 NOTE — Telephone Encounter (Signed)
Philip Atkins called and has the flu and needs to r/s his procedure for tomorrow.

## 2017-12-08 ENCOUNTER — Ambulatory Visit
Admission: RE | Admit: 2017-12-08 | Payer: BLUE CROSS/BLUE SHIELD | Source: Ambulatory Visit | Admitting: Gastroenterology

## 2017-12-08 ENCOUNTER — Encounter: Admission: RE | Payer: Self-pay | Source: Ambulatory Visit

## 2017-12-08 SURGERY — COLONOSCOPY WITH PROPOFOL
Anesthesia: General

## 2017-12-21 ENCOUNTER — Encounter: Payer: Self-pay | Admitting: Family Medicine

## 2017-12-21 ENCOUNTER — Ambulatory Visit (INDEPENDENT_AMBULATORY_CARE_PROVIDER_SITE_OTHER): Payer: BLUE CROSS/BLUE SHIELD | Admitting: Family Medicine

## 2017-12-21 VITALS — BP 128/76 | HR 68 | Temp 98.4°F | Wt 178.3 lb

## 2017-12-21 DIAGNOSIS — I10 Essential (primary) hypertension: Secondary | ICD-10-CM

## 2017-12-21 MED ORDER — LISINOPRIL 10 MG PO TABS: 10.0000 mg | ORAL_TABLET | Freq: Every day | ORAL | 1 refills | Status: DC

## 2017-12-21 NOTE — Assessment & Plan Note (Signed)
Doing much better. Continue current regimen. Continue to monitor. Call with any concerns.  

## 2017-12-21 NOTE — Progress Notes (Signed)
BP 128/76 (BP Location: Left Arm, Cuff Size: Normal)   Pulse 68   Temp 98.4 F (36.9 C)   Wt 178 lb 5 oz (80.9 kg)   SpO2 97%   BMI 27.84 kg/m    Subjective:    Patient ID: Philip Atkins, male    DOB: 08-29-1956, 62 y.o.   MRN: [id number]  HPI: Treyden Hakim is a 62 y.o. male  Chief Complaint  Patient presents with  . Hypertension   HYPERTENSION Hypertension status: controlled  Satisfied with current treatment? no Duration of hypertension: chronic BP monitoring frequency:  daily BP medication side effects:  no Medication compliance: excellent compliance Previous BP meds: lisinopril Aspirin: yes Recurrent headaches: yes- 5x a week, dull headache Visual changes: no Palpitations: no Dyspnea: no Chest pain: no Lower extremity edema: no Dizzy/lightheaded: no  Relevant past medical, surgical, family and social history reviewed and updated as indicated. Interim medical history since our last visit reviewed. Allergies and medications reviewed and updated.  Review of Systems  Constitutional: Negative.   Respiratory: Negative.   Cardiovascular: Negative.   Psychiatric/Behavioral: Negative.     Per HPI unless specifically indicated above     Objective:    BP 128/76 (BP Location: Left Arm, Cuff Size: Normal)   Pulse 68   Temp 98.4 F (36.9 C)   Wt 178 lb 5 oz (80.9 kg)   SpO2 97%   BMI 27.84 kg/m   Wt Readings from Last 3 Encounters:  12/21/17 178 lb 5 oz (80.9 kg)  12/02/17 178 lb 5 oz (80.9 kg)  09/18/17 172 lb 2 oz (78.1 kg)    Physical Exam  Constitutional: He is oriented to person, place, and time. He appears well-developed and well-nourished. No distress.  HENT:  Head: Normocephalic and atraumatic.  Right Ear: Hearing normal.  Left Ear: Hearing normal.  Nose: Nose normal.  Eyes: Conjunctivae and lids are normal. Right eye exhibits no discharge. Left eye exhibits no discharge. No scleral icterus.  Pulmonary/Chest: Effort normal. No respiratory  distress.  Musculoskeletal: Normal range of motion.  Neurological: He is alert and oriented to person, place, and time.  Skin: Skin is intact. No rash noted.  Psychiatric: He has a normal mood and affect. His speech is normal and behavior is normal. Judgment and thought content normal. Cognition and memory are normal.    Results for orders placed or performed in visit on 09/18/17  CBC with Differential/Platelet  Result Value Ref Range   WBC 7.6 3.4 - 10.8 x10E3/uL   RBC 4.91 4.14 - 5.80 x10E6/uL   Hemoglobin 16.1 13.0 - 17.7 g/dL   Hematocrit 47.5 37.5 - 51.0 %   MCV 97 79 - 97 fL   MCH 32.8 26.6 - 33.0 pg   MCHC 33.9 31.5 - 35.7 g/dL   RDW 13.3 12.3 - 15.4 %   Platelets 219 150 - 379 x10E3/uL   Neutrophils 61 Not Estab. %   Lymphs 32 Not Estab. %   Monocytes 5 Not Estab. %   Eos 2 Not Estab. %   Basos 0 Not Estab. %   Neutrophils Absolute 4.6 1.4 - 7.0 x10E3/uL   Lymphocytes Absolute 2.4 0.7 - 3.1 x10E3/uL   Monocytes Absolute 0.4 0.1 - 0.9 x10E3/uL   EOS (ABSOLUTE) 0.1 0.0 - 0.4 x10E3/uL   Basophils Absolute 0.0 0.0 - 0.2 x10E3/uL   Immature Granulocytes 0 Not Estab. %   Immature Grans (Abs) 0.0 0.0 - 0.1 x10E3/uL  Comprehensive metabolic panel  Result Value  Ref Range   Glucose 87 65 - 99 mg/dL   BUN 13 8 - 27 mg/dL   Creatinine, Ser 1.03 0.76 - 1.27 mg/dL   GFR calc non Af Amer 77 >59 mL/min/1.73   GFR calc Af Amer 90 >59 mL/min/1.73   BUN/Creatinine Ratio 13 10 - 24   Sodium 145 (H) 134 - 144 mmol/L   Potassium 5.0 3.5 - 5.2 mmol/L   Chloride 104 96 - 106 mmol/L   CO2 26 20 - 29 mmol/L   Calcium 10.1 8.6 - 10.2 mg/dL   Total Protein 7.4 6.0 - 8.5 g/dL   Albumin 4.9 (H) 3.6 - 4.8 g/dL   Globulin, Total 2.5 1.5 - 4.5 g/dL   Albumin/Globulin Ratio 2.0 1.2 - 2.2   Bilirubin Total 0.4 0.0 - 1.2 mg/dL   Alkaline Phosphatase 71 39 - 117 IU/L   AST 24 0 - 40 IU/L   ALT 24 0 - 44 IU/L  Lipid Panel w/o Chol/HDL Ratio  Result Value Ref Range   Cholesterol, Total 224 (H)  100 - 199 mg/dL   Triglycerides 243 (H) 0 - 149 mg/dL   HDL 52 >39 mg/dL   VLDL Cholesterol Cal 49 (H) 5 - 40 mg/dL   LDL Calculated 123 (H) 0 - 99 mg/dL  PSA  Result Value Ref Range   Prostate Specific Ag, Serum 1.0 0.0 - 4.0 ng/mL  TSH  Result Value Ref Range   TSH 1.800 0.450 - 4.500 uIU/mL  UA/M w/rflx Culture, Routine  Result Value Ref Range   Specific Gravity, UA 1.005 1.005 - 1.030   pH, UA 6.5 5.0 - 7.5   Color, UA Yellow Yellow   Appearance Ur Clear Clear   Leukocytes, UA Negative Negative   Protein, UA Negative Negative/Trace   Glucose, UA Negative Negative   Ketones, UA Negative Negative   RBC, UA Negative Negative   Bilirubin, UA Negative Negative   Urobilinogen, Ur 0.2 0.2 - 1.0 mg/dL   Nitrite, UA Negative Negative  Microalbumin, Urine Waived  Result Value Ref Range   Microalb, Ur Waived 10 0 - 19 mg/L   Creatinine, Urine Waived 50 10 - 300 mg/dL   Microalb/Creat Ratio 30-300 (H) <30 mg/g      Assessment & Plan:   Problem List Items Addressed This Visit      Cardiovascular and Mediastinum   HTN (hypertension) - Primary    Doing much better. Continue current regimen. Continue to monitor. Call with any concerns.       Relevant Medications   lisinopril (PRINIVIL,ZESTRIL) 10 MG tablet   Other Relevant Orders   Basic metabolic panel       Follow up plan: Return in about 6 months (around 06/20/2018) for Follow up.

## 2017-12-22 ENCOUNTER — Encounter: Payer: Self-pay | Admitting: Family Medicine

## 2017-12-22 LAB — BASIC METABOLIC PANEL
BUN / CREAT RATIO: 25 — AB (ref 10–24)
BUN: 15 mg/dL (ref 8–27)
CHLORIDE: 105 mmol/L (ref 96–106)
CO2: 19 mmol/L — AB (ref 20–29)
Calcium: 9.3 mg/dL (ref 8.6–10.2)
Creatinine, Ser: 0.61 mg/dL — ABNORMAL LOW (ref 0.76–1.27)
GFR calc Af Amer: 124 mL/min/{1.73_m2} (ref >59)
GFR calc non Af Amer: 107 mL/min/{1.73_m2} (ref >59)
GLUCOSE: 95 mg/dL (ref 65–99)
Potassium: 4.6 mmol/L (ref 3.5–5.2)
Sodium: 143 mmol/L (ref 134–144)

## 2018-04-07 ENCOUNTER — Encounter: Payer: Self-pay | Admitting: Family Medicine

## 2018-06-21 ENCOUNTER — Ambulatory Visit: Payer: BLUE CROSS/BLUE SHIELD | Admitting: Family Medicine

## 2018-06-24 ENCOUNTER — Ambulatory Visit (INDEPENDENT_AMBULATORY_CARE_PROVIDER_SITE_OTHER): Payer: BLUE CROSS/BLUE SHIELD | Admitting: Family Medicine

## 2018-06-24 ENCOUNTER — Encounter: Payer: Self-pay | Admitting: Family Medicine

## 2018-06-24 VITALS — BP 118/72 | HR 69 | Temp 98.7°F | Wt 175.3 lb

## 2018-06-24 DIAGNOSIS — I10 Essential (primary) hypertension: Secondary | ICD-10-CM | POA: Diagnosis not present

## 2018-06-24 DIAGNOSIS — E782 Mixed hyperlipidemia: Secondary | ICD-10-CM

## 2018-06-24 DIAGNOSIS — I129 Hypertensive chronic kidney disease with stage 1 through stage 4 chronic kidney disease, or unspecified chronic kidney disease: Secondary | ICD-10-CM

## 2018-06-24 MED ORDER — SILDENAFIL CITRATE 20 MG PO TABS: ORAL_TABLET | ORAL | 12 refills | Status: DC

## 2018-06-24 MED ORDER — LISINOPRIL 10 MG PO TABS: 10.0000 mg | ORAL_TABLET | Freq: Every day | ORAL | 1 refills | Status: DC

## 2018-06-24 NOTE — Assessment & Plan Note (Signed)
Under good control on current regimen. Continue to monitor. Call with any concerns. Refills given today.

## 2018-06-24 NOTE — Assessment & Plan Note (Signed)
Not on any medicine. Elevated at last check. Rechecking today. Call with any concerns. Continue to monitor.

## 2018-06-24 NOTE — Progress Notes (Signed)
BP 118/72 (BP Location: Left Arm, Cuff Size: Normal)   Pulse 69   Temp 98.7 F (37.1 C)   Wt 175 lb 5 oz (79.5 kg)   SpO2 97%   BMI 27.38 kg/m    Subjective:    Patient ID: Philip Atkins, male    DOB: November 17, 1956, 62 y.o.   MRN: [id number]  HPI: Philip Atkins is a 62 y.o. male  Chief Complaint  Patient presents with  . Hypertension  . Hyperlipidemia   HYPERTENSION / HYPERLIPIDEMIA Satisfied with current treatment? yes Duration of hypertension: chronic BP monitoring frequency: not checking BP medication side effects: no Past BP meds: lisinopril Duration of hyperlipidemia: chronic Cholesterol medication side effects: Not on anything Cholesterol supplements: none Past cholesterol medications: none Medication compliance: excellent compliance Aspirin: yes Recent stressors: no Recurrent headaches: no Visual changes: no Palpitations: no Dyspnea: no Chest pain: no Lower extremity edema: no Dizzy/lightheaded: no  Relevant past medical, surgical, family and social history reviewed and updated as indicated. Interim medical history since our last visit reviewed. Allergies and medications reviewed and updated.  Review of Systems  Constitutional: Negative.   Respiratory: Negative.   Cardiovascular: Negative.   Gastrointestinal: Negative.   Neurological: Negative.   Psychiatric/Behavioral: Negative.     Per HPI unless specifically indicated above     Objective:    BP 118/72 (BP Location: Left Arm, Cuff Size: Normal)   Pulse 69   Temp 98.7 F (37.1 C)   Wt 175 lb 5 oz (79.5 kg)   SpO2 97%   BMI 27.38 kg/m   Wt Readings from Last 3 Encounters:  06/24/18 175 lb 5 oz (79.5 kg)  12/21/17 178 lb 5 oz (80.9 kg)  12/02/17 178 lb 5 oz (80.9 kg)    Physical Exam  Constitutional: He is oriented to person, place, and time. He appears well-developed and well-nourished. No distress.  HENT:  Head: Normocephalic and atraumatic.  Right Ear: Hearing normal.  Left Ear:  Hearing normal.  Nose: Nose normal.  Eyes: Conjunctivae and lids are normal. Right eye exhibits no discharge. Left eye exhibits no discharge. No scleral icterus.  Cardiovascular: Normal rate, regular rhythm, normal heart sounds and intact distal pulses. Exam reveals no gallop and no friction rub.  No murmur heard. Pulmonary/Chest: Effort normal and breath sounds normal. No stridor. No respiratory distress. He has no wheezes. He has no rales. He exhibits no tenderness.  Musculoskeletal: Normal range of motion.  Neurological: He is alert and oriented to person, place, and time.  Skin: Skin is warm, dry and intact. Capillary refill takes less than 2 seconds. No rash noted. He is not diaphoretic. No erythema. No pallor.  Psychiatric: He has a normal mood and affect. His speech is normal and behavior is normal. Judgment and thought content normal. Cognition and memory are normal.  Nursing note and vitals reviewed.   Results for orders placed or performed in visit on 24/40/10  Basic metabolic panel  Result Value Ref Range   Glucose 95 65 - 99 mg/dL   BUN 15 8 - 27 mg/dL   Creatinine, Ser 0.61 (L) 0.76 - 1.27 mg/dL   GFR calc non Af Amer 107 >59 mL/min/1.73   GFR calc Af Amer 124 >59 mL/min/1.73   BUN/Creatinine Ratio 25 (H) 10 - 24   Sodium 143 134 - 144 mmol/L   Potassium 4.6 3.5 - 5.2 mmol/L   Chloride 105 96 - 106 mmol/L   CO2 19 (L) 20 - 29 mmol/L  Calcium 9.3 8.6 - 10.2 mg/dL      Assessment & Plan:   Problem List Items Addressed This Visit      Genitourinary   Benign hypertensive renal disease - Primary    Under good control on current regimen. Continue to monitor. Call with any concerns. Refills given today.      Relevant Orders   Comprehensive metabolic panel     Other   Mixed hyperlipidemia    Not on any medicine. Elevated at last check. Rechecking today. Call with any concerns. Continue to monitor.       Relevant Medications   lisinopril (PRINIVIL,ZESTRIL) 10 MG  tablet   sildenafil (REVATIO) 20 MG tablet   Other Relevant Orders   Comprehensive metabolic panel   Lipid Panel w/o Chol/HDL Ratio       Follow up plan: Return in about 6 months (around 12/25/2018) for physical.

## 2018-06-25 ENCOUNTER — Encounter: Payer: Self-pay | Admitting: Family Medicine

## 2018-06-25 LAB — LIPID PANEL W/O CHOL/HDL RATIO
CHOLESTEROL TOTAL: 222 mg/dL — AB (ref 100–199)
HDL: 44 mg/dL (ref >39)
LDL CALC: 100 mg/dL — AB (ref 0–99)
TRIGLYCERIDES: 390 mg/dL — AB (ref 0–149)
VLDL Cholesterol Cal: 78 mg/dL — ABNORMAL HIGH (ref 5–40)

## 2018-06-25 LAB — COMPREHENSIVE METABOLIC PANEL
ALK PHOS: 66 IU/L (ref 39–117)
ALT: 22 IU/L (ref 0–44)
AST: 21 IU/L (ref 0–40)
Albumin/Globulin Ratio: 2.1 (ref 1.2–2.2)
Albumin: 4.7 g/dL (ref 3.6–4.8)
BUN/Creatinine Ratio: 16 (ref 10–24)
BUN: 13 mg/dL (ref 8–27)
Bilirubin Total: 0.2 mg/dL (ref 0.0–1.2)
CO2: 22 mmol/L (ref 20–29)
CREATININE: 0.81 mg/dL (ref 0.76–1.27)
Calcium: 9.3 mg/dL (ref 8.6–10.2)
Chloride: 101 mmol/L (ref 96–106)
GFR calc Af Amer: 109 mL/min/{1.73_m2} (ref >59)
GFR calc non Af Amer: 95 mL/min/{1.73_m2} (ref >59)
GLOBULIN, TOTAL: 2.2 g/dL (ref 1.5–4.5)
Glucose: 108 mg/dL — ABNORMAL HIGH (ref 65–99)
Potassium: 4.3 mmol/L (ref 3.5–5.2)
SODIUM: 139 mmol/L (ref 134–144)
Total Protein: 6.9 g/dL (ref 6.0–8.5)

## 2018-09-21 ENCOUNTER — Other Ambulatory Visit: Payer: Self-pay

## 2018-09-21 ENCOUNTER — Encounter: Payer: Self-pay | Admitting: Family Medicine

## 2018-09-21 ENCOUNTER — Ambulatory Visit (INDEPENDENT_AMBULATORY_CARE_PROVIDER_SITE_OTHER): Payer: BLUE CROSS/BLUE SHIELD | Admitting: Family Medicine

## 2018-09-21 VITALS — BP 128/72 | HR 61 | Temp 97.8°F | Ht 68.0 in | Wt 180.0 lb

## 2018-09-21 DIAGNOSIS — Z720 Tobacco use: Secondary | ICD-10-CM

## 2018-09-21 DIAGNOSIS — E782 Mixed hyperlipidemia: Secondary | ICD-10-CM

## 2018-09-21 DIAGNOSIS — I129 Hypertensive chronic kidney disease with stage 1 through stage 4 chronic kidney disease, or unspecified chronic kidney disease: Secondary | ICD-10-CM

## 2018-09-21 DIAGNOSIS — Z1211 Encounter for screening for malignant neoplasm of colon: Secondary | ICD-10-CM

## 2018-09-21 DIAGNOSIS — Z125 Encounter for screening for malignant neoplasm of prostate: Secondary | ICD-10-CM | POA: Diagnosis not present

## 2018-09-21 DIAGNOSIS — Z0000 Encounter for general adult medical examination without abnormal findings: Secondary | ICD-10-CM

## 2018-09-21 LAB — UA/M W/RFLX CULTURE, ROUTINE
Bilirubin, UA: NEGATIVE
Glucose, UA: NEGATIVE
KETONES UA: NEGATIVE
LEUKOCYTES UA: NEGATIVE
NITRITE UA: NEGATIVE
PROTEIN UA: NEGATIVE
RBC UA: NEGATIVE
SPEC GRAV UA: 1.02 (ref 1.005–1.030)
Urobilinogen, Ur: 0.2 mg/dL (ref 0.2–1.0)
pH, UA: 6 (ref 5.0–7.5)

## 2018-09-21 MED ORDER — LISINOPRIL 10 MG PO TABS: 10.0000 mg | ORAL_TABLET | Freq: Every day | ORAL | 1 refills | Status: DC

## 2018-09-21 MED ORDER — SILDENAFIL CITRATE 20 MG PO TABS: ORAL_TABLET | ORAL | 12 refills | Status: DC

## 2018-09-21 NOTE — Assessment & Plan Note (Signed)
Not interested in quitting right now. Will let us know if she changes her mind.

## 2018-09-21 NOTE — Patient Instructions (Signed)

## 2018-09-21 NOTE — Progress Notes (Signed)
BP 128/72 (BP Location: Left Arm, Cuff Size: Normal)   Pulse 61   Temp 97.8 F (36.6 C) (Oral)   Ht 5\' 8"  (1.727 m)   Wt 180 lb (81.6 kg)   SpO2 98%   BMI 27.37 kg/m    Subjective:    Patient ID: Philip Atkins, male    DOB: 1956-10-23, 62 y.o.   MRN: [id number]  HPI: Philip Atkins is a 62 y.o. male presenting on 09/21/2018 for comprehensive medical examination. Current medical complaints include:  HYPERTENSION / HYPERLIPIDEMIA Satisfied with current treatment? yes Duration of hypertension: chronic BP monitoring frequency: not checking BP range:  BP medication side effects: no Past BP meds: lisinopril Duration of hyperlipidemia: chronic Cholesterol medication side effects: Not on anything Cholesterol supplements: none Past cholesterol medications: none Medication compliance: excellent compliance Aspirin: yes Recent stressors: no Recurrent headaches: no Visual changes: no Palpitations: no Dyspnea: no Chest pain: no Lower extremity edema: no Dizzy/lightheaded: no  He currently lives with: wife Interim Problems from his last visit: no  Depression Screen done today and results listed below:  Depression screen [facility] 2/9 09/21/2018 09/18/2017 09/11/2016  Decreased Interest 0 0 0  Down, Depressed, Hopeless 0 0 0  PHQ - 2 Score 0 0 0  Altered sleeping 0 - -  Tired, decreased energy 1 - -  Change in appetite 0 - -  Feeling bad or failure about yourself  0 - -  Trouble concentrating 0 - -  Moving slowly or fidgety/restless 0 - -  Suicidal thoughts 0 - -  PHQ-9 Score 1 - -  Difficult doing work/chores Not difficult at all - -    Past Medical History:  Past Medical History:  Diagnosis Date  . Abscess of right buttock 11/03/2015  . Abscess of right hip 11/03/2015  . ED (erectile dysfunction)   . Gastric ulceration   . Migraines   . MRSA (methicillin resistant Staphylococcus aureus) June 2016  . Neoplasm of uncertain behavior   . Neoplasm of uncertain behavior     . Squamous cell carcinoma 06/2013    Surgical History:  Past Surgical History:  Procedure Laterality Date  . COLONOSCOPY  08/08/11   1 benign polyp; one tubular adenoma; repeat 5 years    Medications:  Current Outpatient Medications on File Prior to Visit  Medication Sig  . acetaminophen (TYLENOL) 500 MG tablet Take 500 mg by mouth 2 (two) times daily.  Marland Kitchen aspirin EC 81 MG tablet Take 81 mg by mouth daily.  . Multiple Vitamin (MULTIVITAMIN) tablet Take 1 tablet by mouth daily.   No current facility-administered medications on file prior to visit.     Allergies:  No Known Allergies  Social History:  Social History   Socioeconomic History  . Marital status: Married    Spouse name: Not on file  . Number of children: Not on file  . Years of education: Not on file  . Highest education level: Not on file  Occupational History  . Not on file  Social Needs  . Financial resource strain: Not on file  . Food insecurity:    Worry: Not on file    Inability: Not on file  . Transportation needs:    Medical: Not on file    Non-medical: Not on file  Tobacco Use  . Smoking status: Current Every Day Smoker    Packs/day: 0.50    Types: Cigarettes  . Smokeless tobacco: Never Used  Substance and Sexual Activity  . Alcohol use: No  .  Drug use: No  . Sexual activity: Yes  Lifestyle  . Physical activity:    Days per week: Not on file    Minutes per session: Not on file  . Stress: Not on file  Relationships  . Social connections:    Talks on phone: Not on file    Gets together: Not on file    Attends religious service: Not on file    Active member of club or organization: Not on file    Attends meetings of clubs or organizations: Not on file    Relationship status: Not on file  . Intimate partner violence:    Fear of current or ex partner: Not on file    Emotionally abused: Not on file    Physically abused: Not on file    Forced sexual activity: Not on file  Other Topics  Concern  . Not on file  Social History Narrative  . Not on file   Social History   Tobacco Use  Smoking Status Current Every Day Smoker  . Packs/day: 0.50  . Types: Cigarettes  Smokeless Tobacco Never Used   Social History   Substance and Sexual Activity  Alcohol Use No    Family History:  Family History  Problem Relation Age of Onset  . Heart attack Mother   . Hypertension Mother   . Heart disease Mother   . Asthma Brother   . Diabetes Brother   . Cancer Neg Hx   . COPD Neg Hx   . Stroke Neg Hx     Past medical history, surgical history, medications, allergies, family history and social history reviewed with patient today and changes made to appropriate areas of the chart.   Review of Systems  Constitutional: Negative.   HENT: Negative.   Eyes: Negative.   Respiratory: Negative.   Cardiovascular: Negative.   Gastrointestinal: Positive for heartburn. Negative for abdominal pain, blood in stool, constipation, diarrhea, melena, nausea and vomiting.  Genitourinary: Negative.   Musculoskeletal: Negative.   Skin: Negative.   Neurological: Negative.   Endo/Heme/Allergies: Positive for environmental allergies. Negative for polydipsia. Does not bruise/bleed easily.  Psychiatric/Behavioral: Negative.     All other ROS negative except what is listed above and in the HPI.      Objective:    BP 128/72 (BP Location: Left Arm, Cuff Size: Normal)   Pulse 61   Temp 97.8 F (36.6 C) (Oral)   Ht 5\' 8"  (1.727 m)   Wt 180 lb (81.6 kg)   SpO2 98%   BMI 27.37 kg/m   Wt Readings from Last 3 Encounters:  09/21/18 180 lb (81.6 kg)  06/24/18 175 lb 5 oz (79.5 kg)  12/21/17 178 lb 5 oz (80.9 kg)    Physical Exam  Constitutional: He is oriented to person, place, and time. He appears well-developed and well-nourished. No distress.  HENT:  Head: Normocephalic and atraumatic.  Right Ear: Hearing, tympanic membrane, external ear and ear canal normal.  Left Ear: Hearing,  tympanic membrane, external ear and ear canal normal.  Nose: Nose normal.  Mouth/Throat: Uvula is midline, oropharynx is clear and moist and mucous membranes are normal. No oropharyngeal exudate.  Eyes: Pupils are equal, round, and reactive to light. Conjunctivae, EOM and lids are normal. Right eye exhibits no discharge. Left eye exhibits no discharge. No scleral icterus.  Neck: Normal range of motion. Neck supple. No JVD present. No tracheal deviation present. No thyromegaly present.  Cardiovascular: Normal rate, regular rhythm, normal heart sounds and  intact distal pulses. Exam reveals no gallop and no friction rub.  No murmur heard. Pulmonary/Chest: Effort normal and breath sounds normal. No stridor. No respiratory distress. He has no wheezes. He has no rales. He exhibits no tenderness.  Abdominal: Soft. Bowel sounds are normal. He exhibits no distension and no mass. There is no tenderness. There is no rebound and no guarding. No hernia.  Genitourinary:  Genitourinary Comments: Penis and prostate exam deferred with shared decision making.   Musculoskeletal: Normal range of motion. He exhibits no edema, tenderness or deformity.  Lymphadenopathy:    He has no cervical adenopathy.  Neurological: He is alert and oriented to person, place, and time. He displays normal reflexes. No cranial nerve deficit or sensory deficit. He exhibits normal muscle tone. Coordination normal.  Skin: Skin is warm, dry and intact. Capillary refill takes less than 2 seconds. No rash noted. He is not diaphoretic. No erythema. No pallor.  Psychiatric: He has a normal mood and affect. His speech is normal and behavior is normal. Judgment and thought content normal. Cognition and memory are normal.  Nursing note and vitals reviewed.   Results for orders placed or performed in visit on 06/24/18  Comprehensive metabolic panel  Result Value Ref Range   Glucose 108 (H) 65 - 99 mg/dL   BUN 13 8 - 27 mg/dL   Creatinine, Ser  0.81 0.76 - 1.27 mg/dL   GFR calc non Af Amer 95 >59 mL/min/1.73   GFR calc Af Amer 109 >59 mL/min/1.73   BUN/Creatinine Ratio 16 10 - 24   Sodium 139 134 - 144 mmol/L   Potassium 4.3 3.5 - 5.2 mmol/L   Chloride 101 96 - 106 mmol/L   CO2 22 20 - 29 mmol/L   Calcium 9.3 8.6 - 10.2 mg/dL   Total Protein 6.9 6.0 - 8.5 g/dL   Albumin 4.7 3.6 - 4.8 g/dL   Globulin, Total 2.2 1.5 - 4.5 g/dL   Albumin/Globulin Ratio 2.1 1.2 - 2.2   Bilirubin Total 0.2 0.0 - 1.2 mg/dL   Alkaline Phosphatase 66 39 - 117 IU/L   AST 21 0 - 40 IU/L   ALT 22 0 - 44 IU/L  Lipid Panel w/o Chol/HDL Ratio  Result Value Ref Range   Cholesterol, Total 222 (H) 100 - 199 mg/dL   Triglycerides 390 (H) 0 - 149 mg/dL   HDL 44 >39 mg/dL   VLDL Cholesterol Cal 78 (H) 5 - 40 mg/dL   LDL Calculated 100 (H) 0 - 99 mg/dL      Assessment & Plan:   Problem List Items Addressed This Visit      Genitourinary   Benign hypertensive renal disease    Under good control on current regimen. Continue current regimen. Continue to monitor. Call with any concerns. Refills given. Labs checked today.       Relevant Orders   CBC with Differential/Platelet   Comprehensive metabolic panel   TSH   UA/M w/rflx Culture, Routine     Other   Tobacco abuse    Not interested in quitting right now. Will let us know if she changes her mind.       Relevant Orders   CBC with Differential/Platelet   Comprehensive metabolic panel   TSH   UA/M w/rflx Culture, Routine   Mixed hyperlipidemia    Rechecking levels today. Continue to monitor. Call with any concerns.  Labs checked today.       Relevant Medications   sildenafil (REVATIO) 20  MG tablet   lisinopril (PRINIVIL,ZESTRIL) 10 MG tablet   Other Relevant Orders   Comprehensive metabolic panel   Lipid Panel w/o Chol/HDL Ratio   TSH    Other Visit Diagnoses    Routine general medical examination at a health care facility    -  Primary   Vaccines up to date. Screening labs checked  today. Colonscopy ordered. Call with any concerns. Continue diet and exercise.    Screening for colon cancer       Referral to GI made today.   Relevant Orders   Ambulatory referral to Gastroenterology   Screening for prostate cancer       Labs drawn today. Await results.    Relevant Orders   PSA       Discussed aspirin prophylaxis for myocardial infarction prevention and decision was made to continue ASA  LABORATORY TESTING:  Health maintenance labs ordered today as discussed above.   The natural history of prostate cancer and ongoing controversy regarding screening and potential treatment outcomes of prostate cancer has been discussed with the patient. The meaning of a false positive PSA and a false negative PSA has been discussed. He indicates understanding of the limitations of this screening test and wishes to proceed with screening PSA testing.   IMMUNIZATIONS:   - Tdap: Tetanus vaccination status reviewed: last tetanus booster within 10 years. - Influenza: Up to date - Pneumovax: Not applicable  SCREENING: - Colonoscopy: Ordered today  Discussed with patient purpose of the colonoscopy is to detect colon cancer at curable precancerous or early stages   PATIENT COUNSELING:    Sexuality: Discussed sexually transmitted diseases, partner selection, use of condoms, avoidance of unintended pregnancy  and contraceptive alternatives.   Advised to avoid cigarette smoking.  I discussed with the patient that most people either abstain from alcohol or drink within safe limits (<=14/week and <=4 drinks/occasion for males, <=7/weeks and <= 3 drinks/occasion for females) and that the risk for alcohol disorders and other health effects rises proportionally with the number of drinks per week and how often a drinker exceeds daily limits.  Discussed cessation/primary prevention of drug use and availability of treatment for abuse.   Diet: Encouraged to adjust caloric intake to maintain  or  achieve ideal body weight, to reduce intake of dietary saturated fat and total fat, to limit sodium intake by avoiding high sodium foods and not adding table salt, and to maintain adequate dietary potassium and calcium preferably from fresh fruits, vegetables, and low-fat dairy products.    stressed the importance of regular exercise  Injury prevention: Discussed safety belts, safety helmets, smoke detector, smoking near bedding or upholstery.   Dental health: Discussed importance of regular tooth brushing, flossing, and dental visits.   Follow up plan: NEXT PREVENTATIVE PHYSICAL DUE IN 1 YEAR. Return in about 6 months (around 03/22/2019) for follow up BP.

## 2018-09-21 NOTE — Assessment & Plan Note (Signed)
Rechecking levels today. Continue to monitor. Call with any concerns.  Labs checked today.

## 2018-09-21 NOTE — Assessment & Plan Note (Signed)
Under good control on current regimen. Continue current regimen. Continue to monitor. Call with any concerns. Refills given. Labs checked today.  

## 2018-09-22 ENCOUNTER — Encounter: Payer: Self-pay | Admitting: Family Medicine

## 2018-09-22 LAB — CBC WITH DIFFERENTIAL/PLATELET
BASOS ABS: 0.1 10*3/uL (ref 0.0–0.2)
BASOS: 1 %
EOS (ABSOLUTE): 0.1 10*3/uL (ref 0.0–0.4)
EOS: 3 %
HEMOGLOBIN: 14.9 g/dL (ref 13.0–17.7)
Hematocrit: 43.7 % (ref 37.5–51.0)
IMMATURE GRANS (ABS): 0 10*3/uL (ref 0.0–0.1)
Immature Granulocytes: 0 %
LYMPHS: 30 %
Lymphocytes Absolute: 1.7 10*3/uL (ref 0.7–3.1)
MCH: 33.3 pg — AB (ref 26.6–33.0)
MCHC: 34.1 g/dL (ref 31.5–35.7)
MCV: 98 fL — ABNORMAL HIGH (ref 79–97)
MONOCYTES: 7 %
Monocytes Absolute: 0.4 10*3/uL (ref 0.1–0.9)
Neutrophils Absolute: 3.4 10*3/uL (ref 1.4–7.0)
Neutrophils: 59 %
Platelets: 203 10*3/uL (ref 150–450)
RBC: 4.47 x10E6/uL (ref 4.14–5.80)
RDW: 12.6 % (ref 12.3–15.4)
WBC: 5.7 10*3/uL (ref 3.4–10.8)

## 2018-09-22 LAB — COMPREHENSIVE METABOLIC PANEL
ALBUMIN: 4.7 g/dL (ref 3.6–4.8)
ALT: 18 IU/L (ref 0–44)
AST: 19 IU/L (ref 0–40)
Albumin/Globulin Ratio: 2.5 — ABNORMAL HIGH (ref 1.2–2.2)
Alkaline Phosphatase: 62 IU/L (ref 39–117)
BILIRUBIN TOTAL: 0.3 mg/dL (ref 0.0–1.2)
BUN / CREAT RATIO: 12 (ref 10–24)
BUN: 12 mg/dL (ref 8–27)
CO2: 23 mmol/L (ref 20–29)
CREATININE: 1.01 mg/dL (ref 0.76–1.27)
Calcium: 9.5 mg/dL (ref 8.6–10.2)
Chloride: 105 mmol/L (ref 96–106)
GFR calc non Af Amer: 79 mL/min/{1.73_m2} (ref >59)
GFR, EST AFRICAN AMERICAN: 91 mL/min/{1.73_m2} (ref >59)
GLUCOSE: 80 mg/dL (ref 65–99)
Globulin, Total: 1.9 g/dL (ref 1.5–4.5)
Potassium: 5 mmol/L (ref 3.5–5.2)
Sodium: 142 mmol/L (ref 134–144)
TOTAL PROTEIN: 6.6 g/dL (ref 6.0–8.5)

## 2018-09-22 LAB — LIPID PANEL W/O CHOL/HDL RATIO
Cholesterol, Total: 201 mg/dL — ABNORMAL HIGH (ref 100–199)
HDL: 44 mg/dL (ref >39)
LDL CALC: 111 mg/dL — AB (ref 0–99)
Triglycerides: 231 mg/dL — ABNORMAL HIGH (ref 0–149)
VLDL CHOLESTEROL CAL: 46 mg/dL — AB (ref 5–40)

## 2018-09-22 LAB — TSH: TSH: 1.52 u[IU]/mL (ref 0.450–4.500)

## 2018-09-22 LAB — PSA: PROSTATE SPECIFIC AG, SERUM: 1.1 ng/mL (ref 0.0–4.0)

## 2018-10-04 ENCOUNTER — Encounter: Payer: Self-pay | Admitting: *Deleted

## 2018-10-05 ENCOUNTER — Ambulatory Visit: Payer: BLUE CROSS/BLUE SHIELD | Admitting: Anesthesiology

## 2018-10-05 ENCOUNTER — Encounter: Payer: Self-pay | Admitting: *Deleted

## 2018-10-05 ENCOUNTER — Encounter
Admission: RE | Disposition: A | Discharge status: Home / Self Care | Payer: Self-pay | Source: Ambulatory Visit | Attending: Gastroenterology

## 2018-10-05 ENCOUNTER — Ambulatory Visit
Admission: RE | Admit: 2018-10-05 | Discharge: 2018-10-05 | Disposition: A | Discharge status: Home / Self Care | Payer: BLUE CROSS/BLUE SHIELD | Source: Ambulatory Visit | Attending: Gastroenterology | Admitting: Gastroenterology

## 2018-10-05 DIAGNOSIS — I10 Essential (primary) hypertension: Secondary | ICD-10-CM | POA: Insufficient documentation

## 2018-10-05 DIAGNOSIS — N289 Disorder of kidney and ureter, unspecified: Secondary | ICD-10-CM | POA: Diagnosis not present

## 2018-10-05 DIAGNOSIS — Z833 Family history of diabetes mellitus: Secondary | ICD-10-CM | POA: Insufficient documentation

## 2018-10-05 DIAGNOSIS — D124 Benign neoplasm of descending colon: Secondary | ICD-10-CM

## 2018-10-05 DIAGNOSIS — F17210 Nicotine dependence, cigarettes, uncomplicated: Secondary | ICD-10-CM | POA: Insufficient documentation

## 2018-10-05 DIAGNOSIS — K640 First degree hemorrhoids: Secondary | ICD-10-CM | POA: Diagnosis not present

## 2018-10-05 DIAGNOSIS — Z7982 Long term (current) use of aspirin: Secondary | ICD-10-CM | POA: Diagnosis not present

## 2018-10-05 DIAGNOSIS — Z86010 Personal history of colonic polyps: Secondary | ICD-10-CM | POA: Diagnosis not present

## 2018-10-05 DIAGNOSIS — Z1211 Encounter for screening for malignant neoplasm of colon: Secondary | ICD-10-CM

## 2018-10-05 DIAGNOSIS — D120 Benign neoplasm of cecum: Secondary | ICD-10-CM | POA: Diagnosis not present

## 2018-10-05 DIAGNOSIS — K635 Polyp of colon: Secondary | ICD-10-CM | POA: Diagnosis not present

## 2018-10-05 DIAGNOSIS — K5730 Diverticulosis of large intestine without perforation or abscess without bleeding: Secondary | ICD-10-CM | POA: Insufficient documentation

## 2018-10-05 DIAGNOSIS — Z8614 Personal history of Methicillin resistant Staphylococcus aureus infection: Secondary | ICD-10-CM | POA: Diagnosis not present

## 2018-10-05 DIAGNOSIS — G43909 Migraine, unspecified, not intractable, without status migrainosus: Secondary | ICD-10-CM | POA: Diagnosis not present

## 2018-10-05 DIAGNOSIS — Z8249 Family history of ischemic heart disease and other diseases of the circulatory system: Secondary | ICD-10-CM | POA: Diagnosis not present

## 2018-10-05 DIAGNOSIS — Z825 Family history of asthma and other chronic lower respiratory diseases: Secondary | ICD-10-CM | POA: Diagnosis not present

## 2018-10-05 DIAGNOSIS — Z8719 Personal history of other diseases of the digestive system: Secondary | ICD-10-CM | POA: Insufficient documentation

## 2018-10-05 DIAGNOSIS — Z79899 Other long term (current) drug therapy: Secondary | ICD-10-CM | POA: Diagnosis not present

## 2018-10-05 DIAGNOSIS — D122 Benign neoplasm of ascending colon: Secondary | ICD-10-CM | POA: Diagnosis not present

## 2018-10-05 DIAGNOSIS — D126 Benign neoplasm of colon, unspecified: Secondary | ICD-10-CM | POA: Diagnosis not present

## 2018-10-05 HISTORY — PX: COLONOSCOPY WITH PROPOFOL: SHX5780

## 2018-10-05 HISTORY — DX: Essential (primary) hypertension: I10

## 2018-10-05 SURGERY — COLONOSCOPY WITH PROPOFOL
Anesthesia: General

## 2018-10-05 MED ORDER — PROPOFOL 500 MG/50ML IV EMUL
INTRAVENOUS | Status: AC
  Filled 2018-10-05: qty 50

## 2018-10-05 MED ORDER — SODIUM CHLORIDE 0.9 % IV SOLN
INTRAVENOUS | Status: DC
  Administered 2018-10-05: 1000 mL via INTRAVENOUS

## 2018-10-05 MED ORDER — SODIUM CHLORIDE (PF) 0.9 % IJ SOLN
INTRAMUSCULAR | Status: DC | PRN
  Administered 2018-10-05: 10 mL via INTRAVENOUS

## 2018-10-05 MED ORDER — LIDOCAINE HCL (CARDIAC) PF 100 MG/5ML IV SOSY
PREFILLED_SYRINGE | INTRAVENOUS | Status: DC | PRN
  Administered 2018-10-05: 50 mg via INTRAVENOUS

## 2018-10-05 MED ORDER — PROPOFOL 10 MG/ML IV BOLUS
INTRAVENOUS | Status: DC | PRN
  Administered 2018-10-05: 80 mg via INTRAVENOUS

## 2018-10-05 MED ORDER — PROPOFOL 500 MG/50ML IV EMUL
INTRAVENOUS | Status: DC | PRN
  Administered 2018-10-05: 175 ug/kg/min via INTRAVENOUS

## 2018-10-05 NOTE — Transfer of Care (Signed)
Immediate Anesthesia Transfer of Care Note  Patient: Philip Atkins  Procedure(s) Performed: COLONOSCOPY WITH PROPOFOL (N/A )  Patient Location: PACU  Anesthesia Type:General  Level of Consciousness: sedated  Airway & Oxygen Therapy: Patient Spontanous Breathing  Post-op Assessment: Report given to RN  Post vital signs: Reviewed and stable  Last Vitals:  Vitals Value Taken Time  BP 94/64 10/05/2018 10:06 AM  Temp 36.5 C 10/05/2018 10:06 AM  Pulse 47 10/05/2018 10:07 AM  Resp 16 10/05/2018 10:07 AM  SpO2 99 % 10/05/2018 10:07 AM  Vitals shown include unvalidated device data.  Last Pain:  Vitals:   10/05/18 1006  TempSrc: Tympanic  PainSc: 0-No pain         Complications: No apparent anesthesia complications

## 2018-10-05 NOTE — Anesthesia Procedure Notes (Signed)
Date/Time: 10/05/2018 9:33 AM Performed by: Johnna Acosta, CRNA Pre-anesthesia Checklist: Patient identified, Emergency Drugs available, Suction available, Patient being monitored and Timeout performed Patient Re-evaluated:Patient Re-evaluated prior to induction Oxygen Delivery Method: Nasal cannula Preoxygenation: Pre-oxygenation with 100% oxygen

## 2018-10-05 NOTE — H&P (Signed)
Jonathon Bellows, MD [address], [place], [place], [place], [address] [address], [place], [place], [place], [address] Phone: [phone]  Fax: [phone]  Primary Care Physician:  Valerie Roys, DO   Pre-Procedure History & Physical: HPI:  Philip Atkins is a 62 y.o. male is here for an colonoscopy.   Past Medical History:  Diagnosis Date  . Abscess of right buttock 11/03/2015  . Abscess of right hip 11/03/2015  . ED (erectile dysfunction)   . Gastric ulceration   . Hypertension   . Migraines   . MRSA (methicillin resistant Staphylococcus aureus) June 2016  . Neoplasm of uncertain behavior   . Neoplasm of uncertain behavior   . Squamous cell carcinoma 06/2013    Past Surgical History:  Procedure Laterality Date  . COLONOSCOPY  08/08/11   1 benign polyp; one tubular adenoma; repeat 5 years  . FINGER TENDON REPAIR      Prior to Admission medications   Medication Sig Start Date End Date Taking? Authorizing Provider  lisinopril (PRINIVIL,ZESTRIL) 10 MG tablet Take 1 tablet (10 mg total) by mouth daily. 09/21/18  Yes Johnson, Megan P, DO  acetaminophen (TYLENOL) 500 MG tablet Take 500 mg by mouth 2 (two) times daily.    [provider]  aspirin EC 81 MG tablet Take 81 mg by mouth daily.    [provider]  Multiple Vitamin (MULTIVITAMIN) tablet Take 1 tablet by mouth daily.    [provider]  sildenafil (REVATIO) 20 MG tablet One or two pills by mouth daily prior to intimacy; 09/21/18   Park Liter P, DO    Allergies as of 09/21/2018  . (No Known Allergies)    Family History  Problem Relation Age of Onset  . Heart attack Mother   . Hypertension Mother   . Heart disease Mother   . Asthma Brother   . Diabetes Brother   . Cancer Neg Hx   . COPD Neg Hx   . Stroke Neg Hx     Social History   Socioeconomic History  . Marital status: Married    Spouse name: Not on file  . Number of children: Not on file  . Years of education:  Not on file  . Highest education level: Not on file  Occupational History  . Not on file  Social Needs  . Financial resource strain: Not on file  . Food insecurity:    Worry: Not on file    Inability: Not on file  . Transportation needs:    Medical: Not on file    Non-medical: Not on file  Tobacco Use  . Smoking status: Current Every Day Smoker    Packs/day: 0.50    Types: Cigarettes  . Smokeless tobacco: Never Used  Substance and Sexual Activity  . Alcohol use: No  . Drug use: No  . Sexual activity: Yes  Lifestyle  . Physical activity:    Days per week: Not on file    Minutes per session: Not on file  . Stress: Not on file  Relationships  . Social connections:    Talks on phone: Not on file    Gets together: Not on file    Attends religious service: Not on file    Active member of club or organization: Not on file    Attends meetings of clubs or organizations: Not on file    Relationship status: Not on file  . Intimate partner violence:    Fear of current or ex  partner: Not on file    Emotionally abused: Not on file    Physically abused: Not on file    Forced sexual activity: Not on file  Other Topics Concern  . Not on file  Social History Narrative  . Not on file    Review of Systems: See HPI, otherwise negative ROS  Physical Exam: BP 127/77   Pulse (!) 59   Temp 97.8 F (36.6 C) (Tympanic)   Resp 18   Ht 5\' 7"  (1.702 m)   Wt 78.9 kg   SpO2 99%   BMI 27.25 kg/m  General:   Alert,  pleasant and cooperative in NAD Head:  Normocephalic and atraumatic. Neck:  Supple; no masses or thyromegaly. Lungs:  Clear throughout to auscultation, normal respiratory effort.    Heart:  +S1, +S2, Regular rate and rhythm, No edema. Abdomen:  Soft, nontender and nondistended. Normal bowel sounds, without guarding, and without rebound.   Neurologic:  Alert and  oriented x4;  grossly normal neurologically.  Impression/Plan: Philip Atkins is here for an colonoscopy to  be performed for surveillance due to prior history of colon polyps   Risks, benefits, limitations, and alternatives regarding  colonoscopy have been reviewed with the patient.  Questions have been answered.  All parties agreeable.   Jonathon Bellows, MD  10/05/2018, 9:14 AM

## 2018-10-05 NOTE — Anesthesia Preprocedure Evaluation (Addendum)
Anesthesia Evaluation  Patient identified by MRN, date of birth, ID band Patient awake    Reviewed: Allergy & Precautions, H&P , NPO status , Patient's Chart, lab work & pertinent test results  Airway Mallampati: II  TM Distance: >3 FB     Dental  (+) Upper Dentures   Pulmonary Current Smoker,           Cardiovascular hypertension,      Neuro/Psych  Headaches, negative psych ROS   GI/Hepatic Neg liver ROS, PUD,   Endo/Other  negative endocrine ROS  Renal/GU Renal disease  negative genitourinary   Musculoskeletal   Abdominal   Peds  Hematology negative hematology ROS (+)   Anesthesia Other Findings Past Medical History: 11/03/2015: Abscess of right buttock 11/03/2015: Abscess of right hip No date: ED (erectile dysfunction) No date: Gastric ulceration No date: Hypertension No date: Migraines June 2016: MRSA (methicillin resistant Staphylococcus aureus) No date: Neoplasm of uncertain behavior No date: Neoplasm of uncertain behavior 01/8249: Squamous cell carcinoma  Past Surgical History: 08/08/11: COLONOSCOPY     Comment:  1 benign polyp; one tubular adenoma; repeat 5 years No date: FINGER TENDON REPAIR  BMI    Body Mass Index:  27.25 kg/m      Reproductive/Obstetrics negative OB ROS                            Anesthesia Physical Anesthesia Plan  ASA: II  Anesthesia Plan: General   Post-op Pain Management:    Induction:   PONV Risk Score and Plan: Propofol infusion and TIVA  Airway Management Planned: Natural Airway and Nasal Cannula  Additional Equipment:   Intra-op Plan:   Post-operative Plan:   Informed Consent: I have reviewed the patients History and Physical, chart, labs and discussed the procedure including the risks, benefits and alternatives for the proposed anesthesia with the patient or authorized representative who has indicated his/her understanding and  acceptance.   Dental Advisory Given  Plan Discussed with: Anesthesiologist  Anesthesia Plan Comments:        Anesthesia Quick Evaluation

## 2018-10-05 NOTE — Op Note (Addendum)
[facility] Gastroenterology Patient Name: Rodric Punch Procedure Date: 10/05/2018 9:18 AM MRN: [id number] Account #: [id number] Date of Birth: 1956/10/14 Admit Type: Outpatient Age: 62 Room: [facility] ENDO ROOM 1 Gender: Male Note Status: Finalized Procedure:            Colonoscopy Indications:          High risk colon cancer surveillance: Personal history                        of colonic polyps Providers:            Jonathon Bellows MD, MD Referring MD:         Valerie Roys (Referring MD) Medicines:            Monitored Anesthesia Care Complications:        No immediate complications. Procedure:            Pre-Anesthesia Assessment:                       - Prior to the procedure, a History and Physical was                        performed, and patient medications, allergies and                        sensitivities were reviewed. The patient's tolerance of                        previous anesthesia was reviewed.                       - The risks and benefits of the procedure and the                        sedation options and risks were discussed with the                        patient. All questions were answered and informed                        consent was obtained.                       - ASA Grade Assessment: II - A patient with mild                        systemic disease.                       After obtaining informed consent, the colonoscope was                        passed under direct vision. Throughout the procedure,                        the patient's blood pressure, pulse, and oxygen                        saturations were monitored continuously. The                        Colonoscope  was introduced through the anus and                        advanced to the the cecum, identified by the                        appendiceal orifice, IC valve and transillumination.                        The colonoscopy was performed with ease. The patient               tolerated the procedure well. The quality of the bowel                        preparation was good. Findings:      The perianal and digital rectal examinations were normal.      A 5 mm polyp was found in the cecum. The polyp was sessile. The polyp       was removed with a cold snare. Resection was complete, but the polyp       tissue was not retrieved.      A 3 mm polyp was found in the descending colon. The polyp was sessile.       The polyp was removed with a cold biopsy forceps. Resection and       retrieval were complete.      A 12 mm polyp was found in the proximal ascending colon. The polyp was       sessile. Preparations were made for mucosal resection. 10 mL of saline       was injected with adequate lift of the lesion from the muscularis       propria. Snare mucosal resection with suction (via the working channel)       retrieval was performed. A 12 mm area was resected. Resection and       retrieval were complete.      A 5 mm polyp was found in the ascending colon. The polyp was sessile.       The polyp was removed with a cold snare. Resection and retrieval were       complete.      A 10 mm polyp was found in the descending colon. The polyp was sessile.       Area was successfully injected with 10 mL saline for a lift polypectomy.       The polyp was removed with a hot snare. Resection and retrieval were       complete. To prevent bleeding after the polypectomy, two hemostatic       clips were successfully placed. There was no bleeding during, or at the       end, of the procedure.      Multiple small-mouthed diverticula were found in the sigmoid colon.      Internal hemorrhoids were found during retroflexion. The hemorrhoids       were medium-sized and Grade I (internal hemorrhoids that do not       prolapse).      The exam was otherwise without abnormality on direct and retroflexion       views. Impression:           - One 5 mm polyp in the cecum, removed with a  cold  snare. Complete resection. Polyp tissue not retrieved.                       - One 3 mm polyp in the descending colon, removed with                        a cold biopsy forceps. Resected and retrieved.                       - One 12 mm polyp in the proximal ascending colon,                        removed with mucosal resection. Resected and retrieved.                       - One 5 mm, non-bleeding polyp in the ascending colon,                        removed with a cold snare. Resected and retrieved.                       - One 10 mm polyp in the descending colon, removed with                        a hot snare. Resected and retrieved. Injected. Clips                        were placed.                       - Diverticulosis in the sigmoid colon.                       - Internal hemorrhoids.                       - The examination was otherwise normal on direct and                        retroflexion views.                       - Mucosal resection was performed. Resection and                        retrieval were complete. Recommendation:       - Discharge patient to home (with escort).                       - Resume previous diet.                       - Continue present medications.                       - Await pathology results.                       - Repeat colonoscopy in 3 years for surveillance. Procedure Code(s):    --- Professional ---  24580, 40, Colonoscopy, flexible; with endoscopic                        mucosal resection                       [phone], Colonoscopy, flexible; with removal of tumor(s),                        polyp(s), or other lesion(s) by snare technique                       45380, 45, Colonoscopy, flexible; with biopsy, single                        or multiple                       45381, 15, Colonoscopy, flexible; with directed                        submucosal injection(s), any substance Diagnosis  Code(s):    --- Professional ---                       Z86.010, Personal history of colonic polyps                       D12.0, Benign neoplasm of cecum                       D12.2, Benign neoplasm of ascending colon                       D12.4, Benign neoplasm of descending colon                       K64.0, First degree hemorrhoids                       K57.30, Diverticulosis of large intestine without                        perforation or abscess without bleeding CPT copyright 2018 American Medical Association. All rights reserved. The codes documented in this report are preliminary and upon coder review may  be revised to meet current compliance requirements. Jonathon Bellows, MD Jonathon Bellows MD, MD 10/05/2018 10:07:36 AM This report has been signed electronically. Number of Addenda: 0 Note Initiated On: 10/05/2018 9:18 AM Scope Withdrawal Time: 0 hours 21 minutes 30 seconds  Total Procedure Duration: 0 hours 27 minutes 54 seconds       [facility]

## 2018-10-05 NOTE — Anesthesia Post-op Follow-up Note (Signed)
Anesthesia QCDR form completed.        

## 2018-10-06 LAB — SURGICAL PATHOLOGY

## 2018-10-06 NOTE — Anesthesia Postprocedure Evaluation (Signed)
Anesthesia Post Note  Patient: Philip Atkins  Procedure(s) Performed: COLONOSCOPY WITH PROPOFOL (N/A )  Patient location during evaluation: PACU Anesthesia Type: General Level of consciousness: awake and alert Pain management: pain level controlled Vital Signs Assessment: post-procedure vital signs reviewed and stable Respiratory status: spontaneous breathing, nonlabored ventilation and respiratory function stable Cardiovascular status: blood pressure returned to baseline and stable Postop Assessment: no apparent nausea or vomiting Anesthetic complications: no     Last Vitals:  Vitals:   10/05/18 0756 10/05/18 1006  BP: 127/77 94/64  Pulse: (!) 59 (!) 46  Resp: 18 18  Temp: 36.6 C 36.5 C  SpO2: 99% 98%    Last Pain:  Vitals:   10/06/18 0730  TempSrc:   PainSc: 0-No pain                 Durenda Hurt

## 2018-10-07 ENCOUNTER — Encounter: Payer: Self-pay | Admitting: Gastroenterology

## 2018-10-11 ENCOUNTER — Encounter: Payer: Self-pay | Admitting: Gastroenterology

## 2019-02-03 ENCOUNTER — Ambulatory Visit (INDEPENDENT_AMBULATORY_CARE_PROVIDER_SITE_OTHER): Payer: BLUE CROSS/BLUE SHIELD | Admitting: Family Medicine

## 2019-02-03 ENCOUNTER — Encounter: Payer: Self-pay | Admitting: Family Medicine

## 2019-02-03 VITALS — BP 154/86 | HR 56 | Temp 98.3°F

## 2019-02-03 DIAGNOSIS — Z20828 Contact with and (suspected) exposure to other viral communicable diseases: Secondary | ICD-10-CM | POA: Diagnosis not present

## 2019-02-03 DIAGNOSIS — J3489 Other specified disorders of nose and nasal sinuses: Secondary | ICD-10-CM

## 2019-02-03 DIAGNOSIS — J111 Influenza due to unidentified influenza virus with other respiratory manifestations: Secondary | ICD-10-CM | POA: Diagnosis not present

## 2019-02-03 LAB — VERITOR FLU A/B WAIVED
Influenza A: NEGATIVE
Influenza B: NEGATIVE

## 2019-02-08 NOTE — Progress Notes (Signed)
   BP (!) 154/86   Pulse (!) 56   Temp 98.3 F (36.8 C) (Oral)   SpO2 96%    Subjective:    Patient ID: Philip Atkins, male    DOB: [birth year], 63 y.o.   MRN: [id number]  HPI: Philip Atkins is a 63 y.o. male  Chief Complaint  Patient presents with  . URI   Here today concerned about COVID-19 as his wife has been having cough, SOB, fevers, body aches, chills for the past few days and he's been in close contact. Wanting to be tested to see if he should be quarantining himself. Denies current consistent sxs. Has had a cold for over a week but with mild sxs of rhinorrhea, post nasal drainage, mild cough but these sxs are almost completely resolved now. Denies fevers, chills, body aches, sore throat. Not trying anything OTC for sxs.   Relevant past medical, surgical, family and social history reviewed and updated as indicated. Interim medical history since our last visit reviewed. Allergies and medications reviewed and updated.  Review of Systems  Per HPI unless specifically indicated above     Objective:    BP (!) 154/86   Pulse (!) 56   Temp 98.3 F (36.8 C) (Oral)   SpO2 96%   Wt Readings from Last 3 Encounters:  10/05/18 174 lb (78.9 kg)  09/21/18 180 lb (81.6 kg)  06/24/18 175 lb 5 oz (79.5 kg)    Physical Exam Vitals signs and nursing note reviewed.  Constitutional:      Appearance: Normal appearance.  HENT:     Head: Atraumatic.     Right Ear: Tympanic membrane normal.     Left Ear: Tympanic membrane normal.     Mouth/Throat:     Pharynx: Posterior oropharyngeal erythema present.  Eyes:     Extraocular Movements: Extraocular movements intact.     Conjunctiva/sclera: Conjunctivae normal.  Neck:     Musculoskeletal: Normal range of motion and neck supple.  Cardiovascular:     Rate and Rhythm: Normal rate and regular rhythm.     Heart sounds: Normal heart sounds.  Pulmonary:     Effort: Pulmonary effort is normal.     Breath sounds: Normal breath sounds.   Musculoskeletal: Normal range of motion.  Skin:    General: Skin is warm and dry.  Neurological:     General: No focal deficit present.     Mental Status: He is oriented to person, place, and time.  Psychiatric:        Mood and Affect: Mood normal.        Thought Content: Thought content normal.        Judgment: Judgment normal.     Results for orders placed or performed in visit on 02/03/19  Veritor Flu A/B Waived  Result Value Ref Range   Influenza A Negative Negative   Influenza B Negative Negative      Assessment & Plan:   Problem List Items Addressed This Visit    None    Visit Diagnoses    Rhinorrhea    -  Primary   Sxs resolving, rapid flu neg, but given exposure to contact w/ possible COVID-19 sxs recommend quarantine x 2 weeks. Low risk, does not qualify for testing   Relevant Orders   Veritor Flu A/B Waived (Completed)       Follow up plan: Return if symptoms worsen or fail to improve.

## 2019-02-09 ENCOUNTER — Ambulatory Visit (INDEPENDENT_AMBULATORY_CARE_PROVIDER_SITE_OTHER): Payer: BLUE CROSS/BLUE SHIELD | Admitting: Family Medicine

## 2019-02-09 ENCOUNTER — Encounter: Payer: Self-pay | Admitting: Family Medicine

## 2019-02-09 ENCOUNTER — Other Ambulatory Visit: Payer: Self-pay

## 2019-02-09 VITALS — Wt 175.0 lb

## 2019-02-09 DIAGNOSIS — J069 Acute upper respiratory infection, unspecified: Secondary | ICD-10-CM

## 2019-02-09 NOTE — Progress Notes (Signed)
Wt 175 lb (79.4 kg)   BMI 27.41 kg/m    Subjective:    Patient ID: Philip Atkins, male    DOB: 1956/01/26, 63 y.o.   MRN: [id number]  HPI: Philip Atkins is a 63 y.o. male  Chief Complaint  Patient presents with  . Follow-up    . This visit was completed via FaceTime due to the restrictions of the COVID-19 pandemic. All issues as above were discussed and addressed. Physical exam was done as above through visual confirmation on facetime. If it was felt that the patient should be evaluated in the office, they were directed there. The patient verbally consented to this visit. . Location of the patient: home . Location of the provider: work . Those involved with this call:  . Provider: Merrie Roof, PA-C . CMA: Gerda Diss, CMA . Front Desk/Registration: Jill Side  . Time spent on call: 15 minutes with patient face to face via video conference. More than 50% of this time was spent in counseling and coordination of care.  Here today for f/u viral illness quarantine that started 02/03/19. Pt had been recovering from a cold when his wife started developing flu like symptoms. He was required by his work to quarantine because of her sxs. They are both now asymptomatic, his wife since yesterday and him the past 4-5 days. No lingering sxs. Denies fevers, chills, body aches, CP, SOB.   Relevant past medical, surgical, family and social history reviewed and updated as indicated. Interim medical history since our last visit reviewed. Allergies and medications reviewed and updated.  Review of Systems  Per HPI unless specifically indicated above     Objective:    Wt 175 lb (79.4 kg)   BMI 27.41 kg/m   Wt Readings from Last 3 Encounters:  02/09/19 175 lb (79.4 kg)  10/05/18 174 lb (78.9 kg)  09/21/18 180 lb (81.6 kg)    Physical Exam Vitals signs and nursing note reviewed.  Constitutional:      General: He is not in acute distress.    Appearance: Normal appearance.  HENT:      Head: Atraumatic.     Right Ear: External ear normal.     Left Ear: External ear normal.     Nose: Nose normal. No congestion.     Mouth/Throat:     Mouth: Mucous membranes are moist.     Pharynx: Oropharynx is clear.  Eyes:     Extraocular Movements: Extraocular movements intact.     Conjunctiva/sclera: Conjunctivae normal.  Neck:     Musculoskeletal: Normal range of motion.  Cardiovascular:     Rate and Rhythm: Normal rate and regular rhythm.  Pulmonary:     Effort: Pulmonary effort is normal. No respiratory distress.  Musculoskeletal: Normal range of motion.  Skin:    General: Skin is dry.     Findings: No erythema or rash.  Neurological:     Mental Status: He is oriented to person, place, and time.  Psychiatric:        Mood and Affect: Mood normal.        Thought Content: Thought content normal.        Judgment: Judgment normal.     Results for orders placed or performed in visit on 02/03/19  Veritor Flu A/B Waived  Result Value Ref Range   Influenza A Negative Negative   Influenza B Negative Negative      Assessment & Plan:   Problem List Items Addressed This Visit  None    Visit Diagnoses    Viral URI    -  Primary   Resolved without residual effects. Will provide letter to return to work Monday. Return precautions given       Follow up plan: Return if symptoms worsen or fail to improve.

## 2019-02-11 ENCOUNTER — Telehealth: Payer: Self-pay | Admitting: Family Medicine

## 2019-02-11 NOTE — Telephone Encounter (Signed)
Copied from [place] [id number]. Topic: Quick Communication - See Telephone Encounter >> Feb 11, 2019  3:53 PM Blase Mess A wrote: CRM for notification. See Telephone encounter for: 02/11/19.Patient's wife is calling to see if the letter for the patient to return to work can be emailed to him. Please advise Thank you [phone]

## 2019-02-16 ENCOUNTER — Ambulatory Visit: Payer: BLUE CROSS/BLUE SHIELD | Admitting: Family Medicine

## 2019-02-22 ENCOUNTER — Telehealth: Payer: Self-pay | Admitting: Family Medicine

## 2019-02-22 MED ORDER — LISINOPRIL 10 MG PO TABS: 10.0000 mg | ORAL_TABLET | Freq: Every day | ORAL | 1 refills | Status: DC

## 2019-02-22 NOTE — Telephone Encounter (Signed)
Patient notified

## 2019-02-22 NOTE — Telephone Encounter (Signed)
Please get patient scheduled.  °

## 2019-02-22 NOTE — Telephone Encounter (Signed)
Scheduled virtual visit for Monday that's when he will have bp cuff. He wants to know if he can be sent at least enough pills until then like 5 since he runs out thurs. Please advise

## 2019-02-22 NOTE — Telephone Encounter (Signed)
30 day supply sent to his Rx

## 2019-02-22 NOTE — Telephone Encounter (Signed)
Copied from [place] [id number]. Topic: Quick Communication - Rx Refill/Question >> Feb 22, 2019 10:10 AM Rayann Heman wrote: Medication:lisinopril (PRINIVIL,ZESTRIL) 10 MG tablet [[id number]]   Has the patient contacted their pharmacy?no  Preferred Pharmacy (with phone number or street name):[facility] [address], [place] - [place] [phone] (Phone) [phone] (Fax)   Agent: Please be advised that RX refills may take up to 3 business days. We ask that you follow-up with your pharmacy.

## 2019-02-22 NOTE — Telephone Encounter (Signed)
Due for follow up- please schedule virtual visit for BP follow up and check to see if he has a BP cuff at home. Thanks!

## 2019-02-28 ENCOUNTER — Encounter: Payer: Self-pay | Admitting: Family Medicine

## 2019-02-28 ENCOUNTER — Ambulatory Visit (INDEPENDENT_AMBULATORY_CARE_PROVIDER_SITE_OTHER): Payer: BLUE CROSS/BLUE SHIELD | Admitting: Family Medicine

## 2019-02-28 VITALS — BP 138/80 | HR 68 | Ht 68.0 in | Wt 175.0 lb

## 2019-02-28 DIAGNOSIS — I129 Hypertensive chronic kidney disease with stage 1 through stage 4 chronic kidney disease, or unspecified chronic kidney disease: Secondary | ICD-10-CM | POA: Diagnosis not present

## 2019-02-28 MED ORDER — SILDENAFIL CITRATE 20 MG PO TABS: ORAL_TABLET | ORAL | 12 refills | Status: DC

## 2019-02-28 MED ORDER — LISINOPRIL 10 MG PO TABS: 10.0000 mg | ORAL_TABLET | Freq: Every day | ORAL | 1 refills | Status: DC

## 2019-02-28 NOTE — Progress Notes (Signed)
BP 138/80 (BP Location: Right Wrist, Cuff Size: Normal)   Pulse 68   Ht 5\' 8"  (1.727 m)   Wt 175 lb (79.4 kg)   BMI 26.61 kg/m    Subjective:    Patient ID: Philip Atkins, male    DOB: 05-14-1956, 63 y.o.   MRN: [id number]  HPI: Philip Atkins is a 63 y.o. male  Chief Complaint  Patient presents with  . Follow-up  . Hypertension  . TELEMEDICINE VISIT   HYPERTENSION Hypertension status: stable  Satisfied with current treatment? no Duration of hypertension: chronic BP monitoring frequency:  not checking BP medication side effects:  no Medication compliance: excellent compliance Previous BP meds: lisinopril Aspirin: yes Recurrent headaches: no Visual changes: no Palpitations: no Dyspnea: no Chest pain: no Lower extremity edema: no Dizzy/lightheaded: no  Relevant past medical, surgical, family and social history reviewed and updated as indicated. Interim medical history since our last visit reviewed. Allergies and medications reviewed and updated.  Review of Systems  Constitutional: Negative.   Respiratory: Negative.   Cardiovascular: Negative.   Psychiatric/Behavioral: Negative.     Per HPI unless specifically indicated above     Objective:    BP 138/80 (BP Location: Right Wrist, Cuff Size: Normal)   Pulse 68   Ht 5\' 8"  (1.727 m)   Wt 175 lb (79.4 kg)   BMI 26.61 kg/m   Wt Readings from Last 3 Encounters:  02/28/19 175 lb (79.4 kg)  02/09/19 175 lb (79.4 kg)  10/05/18 174 lb (78.9 kg)    Physical Exam Vitals signs and nursing note reviewed.  Constitutional:      General: He is not in acute distress.    Appearance: Normal appearance. He is not ill-appearing, toxic-appearing or diaphoretic.  HENT:     Head: Normocephalic and atraumatic.     Right Ear: External ear normal.     Left Ear: External ear normal.     Nose: Nose normal.     Mouth/Throat:     Mouth: Mucous membranes are moist.     Pharynx: Oropharynx is clear.  Eyes:     General: No  scleral icterus.       Right eye: No discharge.        Left eye: No discharge.     Conjunctiva/sclera: Conjunctivae normal.     Pupils: Pupils are equal, round, and reactive to light.  Neck:     Musculoskeletal: Normal range of motion.  Pulmonary:     Effort: Pulmonary effort is normal. No respiratory distress.     Comments: Speaking in full sentences Musculoskeletal: Normal range of motion.  Skin:    Coloration: Skin is not jaundiced or pale.     Findings: No bruising, erythema, lesion or rash.  Neurological:     Mental Status: He is alert and oriented to person, place, and time. Mental status is at baseline.  Psychiatric:        Mood and Affect: Mood normal.        Behavior: Behavior normal.        Thought Content: Thought content normal.        Judgment: Judgment normal.     Results for orders placed or performed in visit on 02/03/19  Veritor Flu A/B Waived  Result Value Ref Range   Influenza A Negative Negative   Influenza B Negative Negative      Assessment & Plan:   Problem List Items Addressed This Visit      Genitourinary  Benign hypertensive renal disease - Primary    Doing well. No concerns. Continue current regimen. Will hold on labs right now, normal in November. Continue diet and exercise. Recheck 6 months for physical          Follow up plan: Return in about 7 months (around 09/30/2019) for Physical.    . This visit was completed via FaceTime due to the restrictions of the COVID-19 pandemic. All issues as above were discussed and addressed. Physical exam was done as above through visual confirmation on FaceTime. If it was felt that the patient should be evaluated in the office, they were directed there. The patient verbally consented to this visit. . Location of the patient: home . Location of the provider: home . Those involved with this call:  . Provider: Park Liter, DO . CMA: Gerda Diss, CMA . Front Desk/Registration: Linard Millers  . Time  spent on call: 15 minutes with patient face to face via video conference. More than 50% of this time was spent in counseling and coordination of care. 23 minutes total spent in review of patient's record and preparation of their chart.

## 2019-02-28 NOTE — Assessment & Plan Note (Signed)
Doing well. No concerns. Continue current regimen. Will hold on labs right now, normal in November. Continue diet and exercise. Recheck 6 months for physical

## 2019-09-23 ENCOUNTER — Other Ambulatory Visit: Payer: Self-pay

## 2019-09-26 ENCOUNTER — Other Ambulatory Visit: Payer: Self-pay

## 2019-09-26 ENCOUNTER — Encounter: Payer: Self-pay | Admitting: Family Medicine

## 2019-09-26 ENCOUNTER — Ambulatory Visit (INDEPENDENT_AMBULATORY_CARE_PROVIDER_SITE_OTHER): Payer: BC Managed Care – PPO | Admitting: Family Medicine

## 2019-09-26 VITALS — BP 135/79 | HR 70 | Temp 98.2°F | Ht 67.0 in | Wt 172.2 lb

## 2019-09-26 DIAGNOSIS — Z0000 Encounter for general adult medical examination without abnormal findings: Secondary | ICD-10-CM

## 2019-09-26 DIAGNOSIS — I129 Hypertensive chronic kidney disease with stage 1 through stage 4 chronic kidney disease, or unspecified chronic kidney disease: Secondary | ICD-10-CM

## 2019-09-26 DIAGNOSIS — N529 Male erectile dysfunction, unspecified: Secondary | ICD-10-CM

## 2019-09-26 DIAGNOSIS — E782 Mixed hyperlipidemia: Secondary | ICD-10-CM

## 2019-09-26 LAB — UA/M W/RFLX CULTURE, ROUTINE
Bilirubin, UA: NEGATIVE
Glucose, UA: NEGATIVE
Ketones, UA: NEGATIVE
Leukocytes,UA: NEGATIVE
Nitrite, UA: NEGATIVE
Protein,UA: NEGATIVE
RBC, UA: NEGATIVE
Specific Gravity, UA: 1.025 (ref 1.005–1.030)
Urobilinogen, Ur: 0.2 mg/dL (ref 0.2–1.0)
pH, UA: 5.5 (ref 5.0–7.5)

## 2019-09-26 MED ORDER — ROPINIROLE HCL 1 MG PO TABS: 1.0000 mg | ORAL_TABLET | Freq: Every day | ORAL | 0 refills | Status: DC

## 2019-09-26 NOTE — Assessment & Plan Note (Signed)
Stable and well controlled with prn sildenafil. Continue current regimen

## 2019-09-26 NOTE — Assessment & Plan Note (Signed)
Stable and under good control, continue current regimen 

## 2019-09-26 NOTE — Assessment & Plan Note (Signed)
Diet controlled. Continue good diet and exercise regimen, recheck lipids today

## 2019-09-26 NOTE — Progress Notes (Signed)
BP 135/79 (BP Location: Right Arm, Cuff Size: Normal)   Pulse 70   Temp 98.2 F (36.8 C) (Oral)   Ht 5\' 7"  (1.702 m)   Wt 172 lb 3.2 oz (78.1 kg)   SpO2 97%   BMI 26.97 kg/m    Subjective:    Patient ID: Philip Atkins, male    DOB: 04-13-1956, 63 y.o.   MRN: [id number]  HPI: Philip Atkins is a 63 y.o. male presenting on 09/26/2019 for comprehensive medical examination. Current medical complaints include:see below  HTN - Home BPs 120-130/70s consistently. Taking his medication faithfully without side effects. Denies CP, SOB, HAs, dizziness. Eating healthy and staying very active.   HLD - diet controlled.   ED - on sildenafil prn, tolerating well without issues or concerns.   Wife tells him his legs jerk a lot at night, and he notes a lot of issues when he's laying there trying to fall asleep with restlessness. Has never taken anything for this in the past. No numbness, tingling, sleep issues otherwise.   He currently lives with: Interim Problems from his last visit: no  Depression Screen done today and results listed below:  Depression screen [facility] 2/9 09/26/2019 09/21/2018 09/18/2017 09/11/2016  Decreased Interest 0 0 0 0  Down, Depressed, Hopeless 0 0 0 0  PHQ - 2 Score 0 0 0 0  Altered sleeping - 0 - -  Tired, decreased energy - 1 - -  Change in appetite - 0 - -  Feeling bad or failure about yourself  - 0 - -  Trouble concentrating - 0 - -  Moving slowly or fidgety/restless - 0 - -  Suicidal thoughts - 0 - -  PHQ-9 Score - 1 - -  Difficult doing work/chores - Not difficult at all - -    The patient does not have a history of falls. I did complete a risk assessment for falls. A plan of care for falls was documented.   Past Medical History:  Past Medical History:  Diagnosis Date  . Abscess of right buttock 11/03/2015  . Abscess of right hip 11/03/2015  . ED (erectile dysfunction)   . Gastric ulceration   . Hypertension   . Migraines   . MRSA (methicillin  resistant Staphylococcus aureus) June 2016  . Neoplasm of uncertain behavior   . Neoplasm of uncertain behavior   . Squamous cell carcinoma 06/2013    Surgical History:  Past Surgical History:  Procedure Laterality Date  . COLONOSCOPY  08/08/11   1 benign polyp; one tubular adenoma; repeat 5 years  . COLONOSCOPY WITH PROPOFOL N/A 10/05/2018   Procedure: COLONOSCOPY WITH PROPOFOL;  Surgeon: Jonathon Bellows, MD;  Location: [facility] ENDOSCOPY;  Service: Gastroenterology;  Laterality: N/A;  . FINGER TENDON REPAIR      Medications:  Current Outpatient Medications on File Prior to Visit  Medication Sig  . acetaminophen (TYLENOL) 500 MG tablet Take 500 mg by mouth 2 (two) times daily.  Marland Kitchen aspirin EC 81 MG tablet Take 81 mg by mouth daily.  Marland Kitchen lisinopril (PRINIVIL,ZESTRIL) 10 MG tablet Take 1 tablet (10 mg total) by mouth daily.  . Multiple Vitamin (MULTIVITAMIN) tablet Take 1 tablet by mouth daily.  . sildenafil (REVATIO) 20 MG tablet One or two pills by mouth daily prior to intimacy;   No current facility-administered medications on file prior to visit.     Allergies:  No Known Allergies  Social History:  Social History   Socioeconomic History  . Marital  status: Married    Spouse name: Not on file  . Number of children: Not on file  . Years of education: Not on file  . Highest education level: Not on file  Occupational History  . Not on file  Social Needs  . Financial resource strain: Not on file  . Food insecurity    Worry: Not on file    Inability: Not on file  . Transportation needs    Medical: Not on file    Non-medical: Not on file  Tobacco Use  . Smoking status: Current Every Day Smoker    Packs/day: 0.50    Types: Cigarettes  . Smokeless tobacco: Never Used  Substance and Sexual Activity  . Alcohol use: No  . Drug use: No  . Sexual activity: Yes  Lifestyle  . Physical activity    Days per week: Not on file    Minutes per session: Not on file  . Stress: Not on file   Relationships  . Social Herbalist on phone: Not on file    Gets together: Not on file    Attends religious service: Not on file    Active member of club or organization: Not on file    Attends meetings of clubs or organizations: Not on file    Relationship status: Not on file  . Intimate partner violence    Fear of current or ex partner: Not on file    Emotionally abused: Not on file    Physically abused: Not on file    Forced sexual activity: Not on file  Other Topics Concern  . Not on file  Social History Narrative  . Not on file   Social History   Tobacco Use  Smoking Status Current Every Day Smoker  . Packs/day: 0.50  . Types: Cigarettes  Smokeless Tobacco Never Used   Social History   Substance and Sexual Activity  Alcohol Use No    Family History:  Family History  Problem Relation Age of Onset  . Heart attack Mother   . Hypertension Mother   . Heart disease Mother   . Asthma Brother   . Diabetes Brother   . Cancer Neg Hx   . COPD Neg Hx   . Stroke Neg Hx     Past medical history, surgical history, medications, allergies, family history and social history reviewed with patient today and changes made to appropriate areas of the chart.   Review of Systems - General ROS: negative Psychological ROS: negative Ophthalmic ROS: negative ENT ROS: negative Allergy and Immunology ROS: negative Hematological and Lymphatic ROS: negative Endocrine ROS: negative Breast ROS: negative for breast lumps Respiratory ROS: no cough, shortness of breath, or wheezing Cardiovascular ROS: no chest pain or dyspnea on exertion Gastrointestinal ROS: no abdominal pain, change in bowel habits, or black or bloody stools Genito-Urinary ROS: no dysuria, trouble voiding, or hematuria Musculoskeletal ROS: negative Neurological ROS: positive for - RLS Dermatological ROS: negative All other ROS negative except what is listed above and in the HPI.      Objective:    BP  135/79 (BP Location: Right Arm, Cuff Size: Normal)   Pulse 70   Temp 98.2 F (36.8 C) (Oral)   Ht 5\' 7"  (1.702 m)   Wt 172 lb 3.2 oz (78.1 kg)   SpO2 97%   BMI 26.97 kg/m   Wt Readings from Last 3 Encounters:  09/26/19 172 lb 3.2 oz (78.1 kg)  02/28/19 175 lb (79.4 kg)  02/09/19 175 lb (79.4 kg)    Physical Exam Vitals signs and nursing note reviewed. Exam conducted with a chaperone present.  Constitutional:      General: He is not in acute distress.    Appearance: He is well-developed.  HENT:     Head: Atraumatic.     Right Ear: Tympanic membrane and external ear normal.     Left Ear: Tympanic membrane and external ear normal.     Nose: Nose normal.     Mouth/Throat:     Mouth: Mucous membranes are moist.     Pharynx: Oropharynx is clear.  Eyes:     General: No scleral icterus.    Conjunctiva/sclera: Conjunctivae normal.     Pupils: Pupils are equal, round, and reactive to light.  Neck:     Musculoskeletal: Normal range of motion and neck supple.  Cardiovascular:     Rate and Rhythm: Normal rate and regular rhythm.     Heart sounds: Normal heart sounds. No murmur.  Pulmonary:     Effort: Pulmonary effort is normal. No respiratory distress.     Breath sounds: Normal breath sounds.  Abdominal:     General: Bowel sounds are normal. There is no distension.     Palpations: Abdomen is soft. There is no mass.     Tenderness: There is no abdominal tenderness. There is no guarding.  Genitourinary:    Prostate: Normal.     Comments: Internal and external hemorrhoids present Musculoskeletal: Normal range of motion.        General: No tenderness.  Skin:    General: Skin is warm and dry.     Findings: No rash.  Neurological:     General: No focal deficit present.     Mental Status: He is alert and oriented to person, place, and time.     Deep Tendon Reflexes: Reflexes are normal and symmetric.  Psychiatric:        Mood and Affect: Mood normal.        Behavior: Behavior  normal.        Thought Content: Thought content normal.        Judgment: Judgment normal.     Results for orders placed or performed in visit on 02/03/19  Veritor Flu A/B Waived  Result Value Ref Range   Influenza A Negative Negative   Influenza B Negative Negative      Assessment & Plan:   Problem List Items Addressed This Visit      Genitourinary   Benign hypertensive renal disease - Primary    Stable and under good control, continue current regimen      Relevant Orders   CBC with Differential/Platelet out   Comprehensive metabolic panel   TSH   UA/M w/rflx Culture, Routine     Other   ED (erectile dysfunction)    Stable and well controlled with prn sildenafil. Continue current regimen      Mixed hyperlipidemia    Diet controlled. Continue good diet and exercise regimen, recheck lipids today      Relevant Orders   Lipid Panel w/o Chol/HDL Ratio out    Other Visit Diagnoses    Annual physical exam           Discussed aspirin prophylaxis for myocardial infarction prevention and decision was made to continue ASA  LABORATORY TESTING:  Health maintenance labs ordered today as discussed above.   The natural history of prostate cancer and ongoing controversy regarding screening and potential treatment outcomes  of prostate cancer has been discussed with the patient. The meaning of a false positive PSA and a false negative PSA has been discussed. He indicates understanding of the limitations of this screening test and wishes not to proceed with screening PSA testing.   IMMUNIZATIONS:   - Tdap: Tetanus vaccination status reviewed: last tetanus booster within 10 years. - Influenza: Up to date  SCREENING: - Colonoscopy: Up to date  Discussed with patient purpose of the colonoscopy is to detect colon cancer at curable precancerous or early stages   PATIENT COUNSELING:    Sexuality: Discussed sexually transmitted diseases, partner selection, use of condoms,  avoidance of unintended pregnancy  and contraceptive alternatives.   Advised to avoid cigarette smoking.  I discussed with the patient that most people either abstain from alcohol or drink within safe limits (<=14/week and <=4 drinks/occasion for males, <=7/weeks and <= 3 drinks/occasion for females) and that the risk for alcohol disorders and other health effects rises proportionally with the number of drinks per week and how often a drinker exceeds daily limits.  Discussed cessation/primary prevention of drug use and availability of treatment for abuse.   Diet: Encouraged to adjust caloric intake to maintain  or achieve ideal body weight, to reduce intake of dietary saturated fat and total fat, to limit sodium intake by avoiding high sodium foods and not adding table salt, and to maintain adequate dietary potassium and calcium preferably from fresh fruits, vegetables, and low-fat dairy products.    stressed the importance of regular exercise  Injury prevention: Discussed safety belts, safety helmets, smoke detector, smoking near bedding or upholstery.   Dental health: Discussed importance of regular tooth brushing, flossing, and dental visits.   Follow up plan: NEXT PREVENTATIVE PHYSICAL DUE IN 1 YEAR. Return in about 4 weeks (around 10/24/2019) for Restless legs f/u - can be virtual.

## 2019-09-27 ENCOUNTER — Encounter: Payer: BLUE CROSS/BLUE SHIELD | Admitting: Family Medicine

## 2019-09-27 LAB — COMPREHENSIVE METABOLIC PANEL
ALT: 18 IU/L (ref 0–44)
AST: 20 IU/L (ref 0–40)
Albumin/Globulin Ratio: 2.2 (ref 1.2–2.2)
Albumin: 4.8 g/dL (ref 3.8–4.8)
Alkaline Phosphatase: 70 IU/L (ref 39–117)
BUN/Creatinine Ratio: 14 (ref 10–24)
BUN: 13 mg/dL (ref 8–27)
Bilirubin Total: 0.2 mg/dL (ref 0.0–1.2)
CO2: 25 mmol/L (ref 20–29)
Calcium: 9.5 mg/dL (ref 8.6–10.2)
Chloride: 102 mmol/L (ref 96–106)
Creatinine, Ser: 0.93 mg/dL (ref 0.76–1.27)
GFR calc Af Amer: 100 mL/min/{1.73_m2} (ref >59)
GFR calc non Af Amer: 86 mL/min/{1.73_m2} (ref >59)
Globulin, Total: 2.2 g/dL (ref 1.5–4.5)
Glucose: 92 mg/dL (ref 65–99)
Potassium: 4.5 mmol/L (ref 3.5–5.2)
Sodium: 144 mmol/L (ref 134–144)
Total Protein: 7 g/dL (ref 6.0–8.5)

## 2019-09-27 LAB — LIPID PANEL W/O CHOL/HDL RATIO
Cholesterol, Total: 220 mg/dL — ABNORMAL HIGH (ref 100–199)
HDL: 47 mg/dL (ref >39)
LDL Chol Calc (NIH): 119 mg/dL — ABNORMAL HIGH (ref 0–99)
Triglycerides: 308 mg/dL — ABNORMAL HIGH (ref 0–149)
VLDL Cholesterol Cal: 54 mg/dL — ABNORMAL HIGH (ref 5–40)

## 2019-09-27 LAB — CBC WITH DIFFERENTIAL/PLATELET
Basophils Absolute: 0 10*3/uL (ref 0.0–0.2)
Basos: 0 %
EOS (ABSOLUTE): 0.1 10*3/uL (ref 0.0–0.4)
Eos: 1 %
Hematocrit: 46.4 % (ref 37.5–51.0)
Hemoglobin: 15.7 g/dL (ref 13.0–17.7)
Immature Grans (Abs): 0 10*3/uL (ref 0.0–0.1)
Immature Granulocytes: 0 %
Lymphocytes Absolute: 1.8 10*3/uL (ref 0.7–3.1)
Lymphs: 22 %
MCH: 32.4 pg (ref 26.6–33.0)
MCHC: 33.8 g/dL (ref 31.5–35.7)
MCV: 96 fL (ref 79–97)
Monocytes Absolute: 0.5 10*3/uL (ref 0.1–0.9)
Monocytes: 6 %
Neutrophils Absolute: 5.7 10*3/uL (ref 1.4–7.0)
Neutrophils: 71 %
Platelets: 212 10*3/uL (ref 150–450)
RBC: 4.84 x10E6/uL (ref 4.14–5.80)
RDW: 12.2 % (ref 11.6–15.4)
WBC: 8 10*3/uL (ref 3.4–10.8)

## 2019-09-27 LAB — TSH: TSH: 1.46 u[IU]/mL (ref 0.450–4.500)

## 2019-10-14 ENCOUNTER — Ambulatory Visit: Payer: Self-pay | Admitting: *Deleted

## 2019-10-14 DIAGNOSIS — B029 Zoster without complications: Secondary | ICD-10-CM | POA: Diagnosis not present

## 2019-10-14 NOTE — Telephone Encounter (Signed)
Pt wants to know what he can use for a rash on his left arm. States that it is large bumps surrounded by smaller bumps     When returned call, pt stated that he was at the urgent care now because he felt like the rash is getting worst on his arm and it was spreading. No triage. Routing to [facility]  for review

## 2019-10-17 ENCOUNTER — Other Ambulatory Visit: Payer: Self-pay

## 2019-10-17 ENCOUNTER — Telehealth: Payer: BC Managed Care – PPO | Admitting: Nurse Practitioner

## 2019-10-19 ENCOUNTER — Other Ambulatory Visit: Payer: Self-pay | Admitting: Family Medicine

## 2019-10-24 ENCOUNTER — Ambulatory Visit (INDEPENDENT_AMBULATORY_CARE_PROVIDER_SITE_OTHER): Payer: BC Managed Care – PPO | Admitting: Family Medicine

## 2019-10-24 ENCOUNTER — Encounter: Payer: Self-pay | Admitting: Family Medicine

## 2019-10-24 ENCOUNTER — Other Ambulatory Visit: Payer: Self-pay

## 2019-10-24 DIAGNOSIS — B0229 Other postherpetic nervous system involvement: Secondary | ICD-10-CM

## 2019-10-24 DIAGNOSIS — K2900 Acute gastritis without bleeding: Secondary | ICD-10-CM

## 2019-10-24 DIAGNOSIS — G2581 Restless legs syndrome: Secondary | ICD-10-CM | POA: Diagnosis not present

## 2019-10-24 MED ORDER — SUCRALFATE 1 G PO TABS: 1.0000 g | ORAL_TABLET | Freq: Three times a day (TID) | ORAL | 1 refills | Status: DC

## 2019-10-24 MED ORDER — LISINOPRIL 10 MG PO TABS: 10.0000 mg | ORAL_TABLET | Freq: Every day | ORAL | 1 refills | Status: DC

## 2019-10-24 MED ORDER — ROPINIROLE HCL 1 MG PO TABS: 1.0000 mg | ORAL_TABLET | Freq: Every day | ORAL | 1 refills | Status: DC

## 2019-10-24 MED ORDER — NORTRIPTYLINE HCL 25 MG PO CAPS: 25.0000 mg | ORAL_CAPSULE | Freq: Every day | ORAL | 2 refills | Status: DC

## 2019-10-24 NOTE — Assessment & Plan Note (Signed)
Resolved with the requip. Continue current regimen. Continue to monitor. Refills given. Call with any concerns.

## 2019-10-24 NOTE — Progress Notes (Signed)
There were no vitals taken for this visit.   Subjective:    Patient ID: Philip Atkins, male    DOB: May 13, 1956, 63 y.o.   MRN: [id number]  HPI: Philip Atkins is a 64 y.o. male  Chief Complaint  Patient presents with  . Restless leg  . Herpes Zoster    Patient went to Urgent Care and was diagnosed with Shingles on 10/14/19, he states that he is still in pain   Had shingles- had some pain, still some blisters on his arm. Lots of irritation under the skin. They gave him tramadol, prednisone and valacyclovir. Having pain at night. 7-8/10. Was throwing up because of the prednisone/valtrex- has continued despite being off both. Otherwise doing well.   RESTLESS LEGS Duration: chronic Discomfort description:  Creeping and crawling Pain: yes Location: lower legs Bilateral: yes Symmetric: yes Severity: mild Onset:  gradual Frequency:  At bedtime Symptoms only occur while legs at rest: no Sudden unintentional leg jerking: no Bed partner bothered by leg movements: no LE numbness: no Decreased sensation: no Weakness: no Insomnia: no Daytime somnolence: no Fatigue: no Status: better  Relevant past medical, surgical, family and social history reviewed and updated as indicated. Interim medical history since our last visit reviewed. Allergies and medications reviewed and updated.  Review of Systems  Constitutional: Negative.   Respiratory: Negative.   Cardiovascular: Negative.   Gastrointestinal: Negative.   Musculoskeletal: Positive for myalgias. Negative for arthralgias, back pain, gait problem, joint swelling, neck pain and neck stiffness.  Skin: Positive for rash. Negative for color change, pallor and wound.  Allergic/Immunologic: Negative.   Neurological: Positive for numbness. Negative for dizziness, tremors, seizures, syncope, facial asymmetry, speech difficulty, weakness, light-headedness and headaches.  Hematological: Negative.   Psychiatric/Behavioral: Negative.      Per HPI unless specifically indicated above     Objective:    There were no vitals taken for this visit.  Wt Readings from Last 3 Encounters:  09/26/19 172 lb 3.2 oz (78.1 kg)  02/28/19 175 lb (79.4 kg)  02/09/19 175 lb (79.4 kg)    Physical Exam Vitals signs and nursing note reviewed.  Constitutional:      General: He is not in acute distress.    Appearance: Normal appearance. He is not ill-appearing, toxic-appearing or diaphoretic.  HENT:     Head: Normocephalic and atraumatic.     Right Ear: External ear normal.     Left Ear: External ear normal.     Nose: Nose normal.     Mouth/Throat:     Mouth: Mucous membranes are moist.     Pharynx: Oropharynx is clear.  Eyes:     General: No scleral icterus.       Right eye: No discharge.        Left eye: No discharge.     Conjunctiva/sclera: Conjunctivae normal.     Pupils: Pupils are equal, round, and reactive to light.  Neck:     Musculoskeletal: Normal range of motion.  Pulmonary:     Effort: Pulmonary effort is normal. No respiratory distress.     Comments: Speaking in full sentences Musculoskeletal: Normal range of motion.  Skin:    Coloration: Skin is not jaundiced or pale.     Findings: No bruising, erythema, lesion or rash.  Neurological:     Mental Status: He is alert and oriented to person, place, and time. Mental status is at baseline.  Psychiatric:        Mood and Affect: Mood normal.  Behavior: Behavior normal.        Thought Content: Thought content normal.        Judgment: Judgment normal.     Results for orders placed or performed in visit on 09/26/19  CBC with Differential/Platelet out  Result Value Ref Range   WBC 8.0 3.4 - 10.8 x10E3/uL   RBC 4.84 4.14 - 5.80 x10E6/uL   Hemoglobin 15.7 13.0 - 17.7 g/dL   Hematocrit 46.4 37.5 - 51.0 %   MCV 96 79 - 97 fL   MCH 32.4 26.6 - 33.0 pg   MCHC 33.8 31.5 - 35.7 g/dL   RDW 12.2 11.6 - 15.4 %   Platelets 212 150 - 450 x10E3/uL   Neutrophils 71  Not Estab. %   Lymphs 22 Not Estab. %   Monocytes 6 Not Estab. %   Eos 1 Not Estab. %   Basos 0 Not Estab. %   Neutrophils Absolute 5.7 1.4 - 7.0 x10E3/uL   Lymphocytes Absolute 1.8 0.7 - 3.1 x10E3/uL   Monocytes Absolute 0.5 0.1 - 0.9 x10E3/uL   EOS (ABSOLUTE) 0.1 0.0 - 0.4 x10E3/uL   Basophils Absolute 0.0 0.0 - 0.2 x10E3/uL   Immature Granulocytes 0 Not Estab. %   Immature Grans (Abs) 0.0 0.0 - 0.1 x10E3/uL  Comprehensive metabolic panel  Result Value Ref Range   Glucose 92 65 - 99 mg/dL   BUN 13 8 - 27 mg/dL   Creatinine, Ser 0.93 0.76 - 1.27 mg/dL   GFR calc non Af Amer 86 >59 mL/min/1.73   GFR calc Af Amer 100 >59 mL/min/1.73   BUN/Creatinine Ratio 14 10 - 24   Sodium 144 134 - 144 mmol/L   Potassium 4.5 3.5 - 5.2 mmol/L   Chloride 102 96 - 106 mmol/L   CO2 25 20 - 29 mmol/L   Calcium 9.5 8.6 - 10.2 mg/dL   Total Protein 7.0 6.0 - 8.5 g/dL   Albumin 4.8 3.8 - 4.8 g/dL   Globulin, Total 2.2 1.5 - 4.5 g/dL   Albumin/Globulin Ratio 2.2 1.2 - 2.2   Bilirubin Total 0.2 0.0 - 1.2 mg/dL   Alkaline Phosphatase 70 39 - 117 IU/L   AST 20 0 - 40 IU/L   ALT 18 0 - 44 IU/L  Lipid Panel w/o Chol/HDL Ratio out  Result Value Ref Range   Cholesterol, Total 220 (H) 100 - 199 mg/dL   Triglycerides 308 (H) 0 - 149 mg/dL   HDL 47 >39 mg/dL   VLDL Cholesterol Cal 54 (H) 5 - 40 mg/dL   LDL Chol Calc (NIH) 119 (H) 0 - 99 mg/dL  TSH  Result Value Ref Range   TSH 1.460 0.450 - 4.500 uIU/mL  UA/M w/rflx Culture, Routine   Specimen: Urine   URINE  Result Value Ref Range   Specific Gravity, UA 1.025 1.005 - 1.030   pH, UA 5.5 5.0 - 7.5   Color, UA Yellow Yellow   Appearance Ur Clear Clear   Leukocytes,UA Negative Negative   Protein,UA Negative Negative/Trace   Glucose, UA Negative Negative   Ketones, UA Negative Negative   RBC, UA Negative Negative   Bilirubin, UA Negative Negative   Urobilinogen, Ur 0.2 0.2 - 1.0 mg/dL   Nitrite, UA Negative Negative      Assessment & Plan:    Problem List Items Addressed This Visit      Other   RLS (restless legs syndrome)    Resolved with the requip. Continue current regimen. Continue to  monitor. Refills given. Call with any concerns.        Other Visit Diagnoses    Post herpetic neuralgia    -  Primary   Will start nortriptyline. Call if not getting better or getting worse.    Other acute gastritis without hemorrhage       Will treat with carafate- call if not getting better or getting worse.        Follow up plan: Return in about 6 months (around 04/23/2020) for Physical.    . This visit was completed via D[employer] due to the restrictions of the COVID-19 pandemic. All issues as above were discussed and addressed. Physical exam was done as above through visual confirmation on Doximity. If it was felt that the patient should be evaluated in the office, they were directed there. The patient verbally consented to this visit. . Location of the patient: work . Location of the provider: work . Those involved with this call:  . Provider: Park Liter, DO . CMA: Tiffany Reel, CMA . Front Desk/Registration: Don Perking  . Time spent on call: 25 minutes with patient face to face via video conference. More than 50% of this time was spent in counseling and coordination of care. 40 minutes total spent in review of patient's record and preparation of their chart.

## 2020-01-12 ENCOUNTER — Other Ambulatory Visit: Payer: Self-pay | Admitting: Family Medicine

## 2020-04-30 ENCOUNTER — Other Ambulatory Visit: Payer: Self-pay | Admitting: Family Medicine

## 2020-04-30 MED ORDER — SILDENAFIL CITRATE 20 MG PO TABS: ORAL_TABLET | ORAL | 12 refills | Status: DC

## 2020-04-30 NOTE — Telephone Encounter (Signed)
Medication Refill - Medication: sildenafil (REVATIO) 20 MG tablet   Has the patient contacted their pharmacy? No. (Agent: If no, request that the patient contact the pharmacy for the refill.) (Agent: If yes, when and what did the pharmacy advise?)  Preferred Pharmacy (with phone number or street name):  [place], [place] Phone:  [phone]  Fax:  [phone]       Agent: Please be advised that RX refills may take up to 3 business days. We ask that you follow-up with your pharmacy.

## 2020-04-30 NOTE — Telephone Encounter (Signed)
Requested medication (s) are due for refill today: Yes  Requested medication (s) are on the active medication list: Yes  Last refill:  02/28/19  Future visit scheduled: Yes  Notes to clinic:  Prescription has expired.    Requested Prescriptions  Pending Prescriptions Disp Refills   sildenafil (REVATIO) 20 MG tablet 30 tablet 12    Sig: One or two pills by mouth daily prior to intimacy;      Urology: Erectile Dysfunction Agents Passed - 04/30/2020 11:05 AM      Passed - Last BP in normal range    BP Readings from Last 1 Encounters:  09/26/19 135/79          Passed - Valid encounter within last 12 months    Recent Outpatient Visits           6 months ago Post herpetic neuralgia   [place], [place], DO   7 months ago Benign hypertensive renal disease   [facility] Merrie Roof [place], [place]   1 year ago Benign hypertensive renal disease   [place], [place], [place]   1 year ago Viral URI   [facility] Merrie Roof [place], [place]   1 year ago [place], Lilia [place], [place]       Future Appointments             In 1 week Wynetta Emery, Barb Merino, DO [employer]

## 2020-05-07 ENCOUNTER — Ambulatory Visit: Payer: BC Managed Care – PPO | Admitting: Family Medicine

## 2020-05-14 ENCOUNTER — Encounter: Payer: Self-pay | Admitting: Family Medicine

## 2020-05-14 ENCOUNTER — Ambulatory Visit (INDEPENDENT_AMBULATORY_CARE_PROVIDER_SITE_OTHER): Payer: BC Managed Care – PPO | Admitting: Family Medicine

## 2020-05-14 ENCOUNTER — Other Ambulatory Visit: Payer: Self-pay

## 2020-05-14 VITALS — BP 127/75 | HR 72 | Temp 98.8°F | Ht 66.5 in | Wt 177.0 lb

## 2020-05-14 DIAGNOSIS — I129 Hypertensive chronic kidney disease with stage 1 through stage 4 chronic kidney disease, or unspecified chronic kidney disease: Secondary | ICD-10-CM | POA: Diagnosis not present

## 2020-05-14 DIAGNOSIS — Z0000 Encounter for general adult medical examination without abnormal findings: Secondary | ICD-10-CM

## 2020-05-14 DIAGNOSIS — E782 Mixed hyperlipidemia: Secondary | ICD-10-CM | POA: Diagnosis not present

## 2020-05-14 DIAGNOSIS — N529 Male erectile dysfunction, unspecified: Secondary | ICD-10-CM | POA: Diagnosis not present

## 2020-05-14 LAB — URINALYSIS, ROUTINE W REFLEX MICROSCOPIC
Bilirubin, UA: NEGATIVE
Glucose, UA: NEGATIVE
Ketones, UA: NEGATIVE
Leukocytes,UA: NEGATIVE
Nitrite, UA: NEGATIVE
Protein,UA: NEGATIVE
RBC, UA: NEGATIVE
Specific Gravity, UA: 1.025 (ref 1.005–1.030)
Urobilinogen, Ur: 0.2 mg/dL (ref 0.2–1.0)
pH, UA: 5.5 (ref 5.0–7.5)

## 2020-05-14 LAB — MICROALBUMIN, URINE WAIVED
Creatinine, Urine Waived: 200 mg/dL (ref 10–300)
Microalb, Ur Waived: 30 mg/L — ABNORMAL HIGH (ref 0–19)
Microalb/Creat Ratio: <30 mg/g (ref <30)

## 2020-05-14 MED ORDER — LISINOPRIL 10 MG PO TABS: 10.0000 mg | ORAL_TABLET | Freq: Every day | ORAL | 1 refills | Status: DC

## 2020-05-14 NOTE — Assessment & Plan Note (Signed)
Under good control on current regimen. Continue current regimen. Continue to monitor. Call with any concerns. Refills given. Labs drawn today.   

## 2020-05-14 NOTE — Assessment & Plan Note (Signed)
Rechecking labs today. Await results. Call with any concerns. Treat as needed.

## 2020-05-14 NOTE — Progress Notes (Addendum)
BP 127/75   Pulse 72   Temp 98.8 F (37.1 C) (Oral)   Ht 5' 6.5" (1.689 m)   Wt 177 lb (80.3 kg)   SpO2 97%   BMI 28.14 kg/m    Subjective:    Patient ID: Philip Atkins, male    DOB: 1956-10-20, 64 y.o.   MRN: [id number]  HPI: Philip Atkins is a 64 y.o. male presenting on 05/14/2020 for comprehensive medical examination. Current medical complaints include:  HYPERTENSION / HYPERLIPIDEMIA Satisfied with current treatment? yes Duration of hypertension: chronic BP monitoring frequency: not checking BP medication side effects: no Past BP meds: lisinopril Duration of hyperlipidemia: chronic Cholesterol medication side effects: not on anything Medication compliance: excellent compliance Aspirin: yes Recent stressors: no Recurrent headaches: yes Visual changes: no Palpitations: no Dyspnea: no Chest pain: no Lower extremity edema: no Dizzy/lightheaded: no  He currently lives with: wife Interim Problems from his last visit: no  Depression Screen done today and results listed below:  Depression screen [facility] 2/9 05/14/2020 09/26/2019 09/21/2018 09/18/2017 09/11/2016  Decreased Interest 0 0 0 0 0  Down, Depressed, Hopeless 0 0 0 0 0  PHQ - 2 Score 0 0 0 0 0  Altered sleeping 1 - 0 - -  Tired, decreased energy 1 - 1 - -  Change in appetite 0 - 0 - -  Feeling bad or failure about yourself  0 - 0 - -  Trouble concentrating 0 - 0 - -  Moving slowly or fidgety/restless 0 - 0 - -  Suicidal thoughts 0 - 0 - -  PHQ-9 Score 2 - 1 - -  Difficult doing work/chores Not difficult at all - Not difficult at all - -    Past Medical History:  Past Medical History:  Diagnosis Date  . Abscess of right buttock 11/03/2015  . Abscess of right hip 11/03/2015  . ED (erectile dysfunction)   . Gastric ulceration   . Hypertension   . Migraines   . MRSA (methicillin resistant Staphylococcus aureus) June 2016  . Neoplasm of uncertain behavior   . Neoplasm of uncertain behavior   . Squamous  cell carcinoma 06/2013    Surgical History:  Past Surgical History:  Procedure Laterality Date  . COLONOSCOPY  08/08/11   1 benign polyp; one tubular adenoma; repeat 5 years  . COLONOSCOPY WITH PROPOFOL N/A 10/05/2018   Procedure: COLONOSCOPY WITH PROPOFOL;  Surgeon: Jonathon Bellows, MD;  Location: [facility] ENDOSCOPY;  Service: Gastroenterology;  Laterality: N/A;  . FINGER TENDON REPAIR      Medications:  Current Outpatient Medications on File Prior to Visit  Medication Sig  . acetaminophen (TYLENOL) 500 MG tablet Take 500 mg by mouth 2 (two) times daily.  Marland Kitchen aspirin EC 81 MG tablet Take 81 mg by mouth daily.  . Multiple Vitamin (MULTIVITAMIN) tablet Take 1 tablet by mouth daily.  . sildenafil (REVATIO) 20 MG tablet One or two pills by mouth daily prior to intimacy;  . traMADol (ULTRAM) 50 MG tablet Take 50 mg by mouth every 8 (eight) hours as needed. (Patient not taking: Reported on 05/14/2020)   No current facility-administered medications on file prior to visit.    Allergies:  No Known Allergies  Social History:  Social History   Socioeconomic History  . Marital status: Married    Spouse name: Not on file  . Number of children: Not on file  . Years of education: Not on file  . Highest education level: Not on file  Occupational  History  . Not on file  Tobacco Use  . Smoking status: Current Every Day Smoker    Packs/day: 0.25    Types: Cigarettes  . Smokeless tobacco: Never Used  Vaping Use  . Vaping Use: Some days  Substance and Sexual Activity  . Alcohol use: No  . Drug use: No  . Sexual activity: Yes  Other Topics Concern  . Not on file  Social History Narrative  . Not on file   Social Determinants of Health   Financial Resource Strain:   . Difficulty of Paying Living Expenses:   Food Insecurity:   . Worried About Charity fundraiser in the Last Year:   . Arboriculturist in the Last Year:   Transportation Needs:   . Film/video editor (Medical):   Marland Kitchen Lack of  Transportation (Non-Medical):   Physical Activity:   . Days of Exercise per Week:   . Minutes of Exercise per Session:   Stress:   . Feeling of Stress :   Social Connections:   . Frequency of Communication with Friends and Family:   . Frequency of Social Gatherings with Friends and Family:   . Attends Religious Services:   . Active Member of Clubs or Organizations:   . Attends Archivist Meetings:   Marland Kitchen Marital Status:   Intimate Partner Violence:   . Fear of Current or Ex-Partner:   . Emotionally Abused:   Marland Kitchen Physically Abused:   . Sexually Abused:    Social History   Tobacco Use  Smoking Status Current Every Day Smoker  . Packs/day: 0.25  . Types: Cigarettes  Smokeless Tobacco Never Used   Social History   Substance and Sexual Activity  Alcohol Use No    Family History:  Family History  Problem Relation Age of Onset  . Heart attack Mother   . Hypertension Mother   . Heart disease Mother   . Asthma Brother   . Diabetes Brother   . Cancer Neg Hx   . COPD Neg Hx   . Stroke Neg Hx     Past medical history, surgical history, medications, allergies, family history and social history reviewed with patient today and changes made to appropriate areas of the chart.   Review of Systems  Constitutional: Negative.   HENT: Negative.   Eyes: Negative.   Respiratory: Negative.   Cardiovascular: Negative.   Gastrointestinal: Negative.   Genitourinary: Negative.   Musculoskeletal: Negative.   Skin: Negative.   Neurological: Negative.   Endo/Heme/Allergies: Positive for environmental allergies. Negative for polydipsia. Does not bruise/bleed easily.  Psychiatric/Behavioral: Negative.     All other ROS negative except what is listed above and in the HPI.      Objective:    BP 127/75   Pulse 72   Temp 98.8 F (37.1 C) (Oral)   Ht 5' 6.5" (1.689 m)   Wt 177 lb (80.3 kg)   SpO2 97%   BMI 28.14 kg/m   Wt Readings from Last 3 Encounters:  05/14/20 177 lb  (80.3 kg)  09/26/19 172 lb 3.2 oz (78.1 kg)  02/28/19 175 lb (79.4 kg)    Physical Exam Vitals and nursing note reviewed.  Constitutional:      General: He is not in acute distress.    Appearance: Normal appearance. He is normal weight. He is not ill-appearing, toxic-appearing or diaphoretic.  HENT:     Head: Normocephalic and atraumatic.     Right Ear: Tympanic membrane, ear  canal and external ear normal. There is no impacted cerumen.     Left Ear: Tympanic membrane, ear canal and external ear normal. There is no impacted cerumen.     Nose: Nose normal. No congestion or rhinorrhea.     Mouth/Throat:     Mouth: Mucous membranes are moist.     Pharynx: Oropharynx is clear. No oropharyngeal exudate or posterior oropharyngeal erythema.  Eyes:     General: No scleral icterus.       Right eye: No discharge.        Left eye: No discharge.     Extraocular Movements: Extraocular movements intact.     Conjunctiva/sclera: Conjunctivae normal.     Pupils: Pupils are equal, round, and reactive to light.  Neck:     Vascular: No carotid bruit.  Cardiovascular:     Rate and Rhythm: Normal rate and regular rhythm.     Pulses: Normal pulses.     Heart sounds: No murmur heard.  No friction rub. No gallop.   Pulmonary:     Effort: Pulmonary effort is normal. No respiratory distress.     Breath sounds: Normal breath sounds. No stridor. No wheezing, rhonchi or rales.  Chest:     Chest wall: No tenderness.  Abdominal:     General: Abdomen is flat. Bowel sounds are normal. There is no distension.     Palpations: Abdomen is soft. There is no mass.     Tenderness: There is no abdominal tenderness. There is no right CVA tenderness, left CVA tenderness, guarding or rebound.     Hernia: No hernia is present.  Genitourinary:    Comments: Genital exam deferred with shared decision making Musculoskeletal:        General: No swelling, tenderness, deformity or signs of injury.     Cervical back: Normal  range of motion and neck supple. No rigidity. No muscular tenderness.     Right lower leg: No edema.     Left lower leg: No edema.  Lymphadenopathy:     Cervical: No cervical adenopathy.  Skin:    General: Skin is warm and dry.     Capillary Refill: Capillary refill takes less than 2 seconds.     Coloration: Skin is not jaundiced or pale.     Findings: No bruising, erythema, lesion or rash.  Neurological:     General: No focal deficit present.     Mental Status: He is alert and oriented to person, place, and time.     Cranial Nerves: No cranial nerve deficit.     Sensory: No sensory deficit.     Motor: No weakness.     Coordination: Coordination normal.     Gait: Gait normal.     Deep Tendon Reflexes: Reflexes normal.  Psychiatric:        Mood and Affect: Mood normal.        Behavior: Behavior normal.        Thought Content: Thought content normal.        Judgment: Judgment normal.     Results for orders placed or performed in visit on 09/26/19  CBC with Differential/Platelet out  Result Value Ref Range   WBC 8.0 3.4 - 10.8 x10E3/uL   RBC 4.84 4.14 - 5.80 x10E6/uL   Hemoglobin 15.7 13.0 - 17.7 g/dL   Hematocrit 46.4 37.5 - 51.0 %   MCV 96 79 - 97 fL   MCH 32.4 26.6 - 33.0 pg   MCHC 33.8 31 - 35  g/dL   RDW 12.2 11.6 - 15.4 %   Platelets 212 150 - 450 x10E3/uL   Neutrophils 71 Not Estab. %   Lymphs 22 Not Estab. %   Monocytes 6 Not Estab. %   Eos 1 Not Estab. %   Basos 0 Not Estab. %   Neutrophils Absolute 5.7 1 - 7 x10E3/uL   Lymphocytes Absolute 1.8 0 - 3 x10E3/uL   Monocytes Absolute 0.5 0 - 0 x10E3/uL   EOS (ABSOLUTE) 0.1 0.0 - 0.4 x10E3/uL   Basophils Absolute 0.0 0 - 0 x10E3/uL   Immature Granulocytes 0 Not Estab. %   Immature Grans (Abs) 0.0 0.0 - 0.1 x10E3/uL  Comprehensive metabolic panel  Result Value Ref Range   Glucose 92 65 - 99 mg/dL   BUN 13 8 - 27 mg/dL   Creatinine, Ser 0.93 0.76 - 1.27 mg/dL   GFR calc non Af Amer 86 >59 mL/min/1.73   GFR calc  Af Amer 100 >59 mL/min/1.73   BUN/Creatinine Ratio 14 10 - 24   Sodium 144 134 - 144 mmol/L   Potassium 4.5 3.5 - 5.2 mmol/L   Chloride 102 96 - 106 mmol/L   CO2 25 20 - 29 mmol/L   Calcium 9.5 8.6 - 10.2 mg/dL   Total Protein 7.0 6.0 - 8.5 g/dL   Albumin 4.8 3.8 - 4.8 g/dL   Globulin, Total 2.2 1.5 - 4.5 g/dL   Albumin/Globulin Ratio 2.2 1.2 - 2.2   Bilirubin Total 0.2 0.0 - 1.2 mg/dL   Alkaline Phosphatase 70 39 - 117 IU/L   AST 20 0 - 40 IU/L   ALT 18 0 - 44 IU/L  Lipid Panel w/o Chol/HDL Ratio out  Result Value Ref Range   Cholesterol, Total 220 (H) 100 - 199 mg/dL   Triglycerides 308 (H) 0 - 149 mg/dL   HDL 47 >39 mg/dL   VLDL Cholesterol Cal 54 (H) 5 - 40 mg/dL   LDL Chol Calc (NIH) 119 (H) 0 - 99 mg/dL  TSH  Result Value Ref Range   TSH 1.460 0.450 - 4.500 uIU/mL  UA/M w/rflx Culture, Routine   Specimen: Urine   URINE  Result Value Ref Range   Specific Gravity, UA 1.025 1.005 - 1.030   pH, UA 5.5 5.0 - 7.5   Color, UA Yellow Yellow   Appearance Ur Clear Clear   Leukocytes,UA Negative Negative   Protein,UA Negative Negative/Trace   Glucose, UA Negative Negative   Ketones, UA Negative Negative   RBC, UA Negative Negative   Bilirubin, UA Negative Negative   Urobilinogen, Ur 0.2 0.2 - 1.0 mg/dL   Nitrite, UA Negative Negative      Assessment & Plan:   Problem List Items Addressed This Visit      Genitourinary   Benign hypertensive renal disease    Under good control on current regimen. Continue current regimen. Continue to monitor. Call with any concerns. Refills given. Labs drawn today.       Relevant Orders   Comprehensive metabolic panel   Microalbumin, Urine Waived   TSH     Other   ED (erectile dysfunction)    Under good control on current regimen. Continue current regimen. Continue to monitor. Call with any concerns. Refills given previously. Call with any concerns.       Mixed hyperlipidemia    Rechecking labs today. Await results. Call with  any concerns. Treat as needed.       Relevant Medications   lisinopril (ZESTRIL)  10 MG tablet   Other Relevant Orders   Comprehensive metabolic panel   Lipid Panel w/o Chol/HDL Ratio    Other Visit Diagnoses    Routine general medical examination at a health care facility    -  Primary   Vaccines up to date. Screening labs checked today. Colonoscopy up to date. Continue diet and exercise. Call with any concerns.    Relevant Orders   CBC with Differential/Platelet   Comprehensive metabolic panel   Lipid Panel w/o Chol/HDL Ratio   Microalbumin, Urine Waived   PSA   TSH   Urinalysis, Routine w reflex microscopic       LABORATORY TESTING:  Health maintenance labs ordered today as discussed above.   The natural history of prostate cancer and ongoing controversy regarding screening and potential treatment outcomes of prostate cancer has been discussed with the patient. The meaning of a false positive PSA and a false negative PSA has been discussed. He indicates understanding of the limitations of this screening test and wishes to proceed with screening PSA testing.   IMMUNIZATIONS:   - Tdap: Tetanus vaccination status reviewed: last tetanus booster within 10 years. - Influenza: Up to date  SCREENING: - Colonoscopy: Up to date  Discussed with patient purpose of the colonoscopy is to detect colon cancer at curable precancerous or early stages   PATIENT COUNSELING:    Sexuality: Discussed sexually transmitted diseases, partner selection, use of condoms, avoidance of unintended pregnancy  and contraceptive alternatives.   Advised to avoid cigarette smoking.  I discussed with the patient that most people either abstain from alcohol or drink within safe limits (<=14/week and <=4 drinks/occasion for males, <=7/weeks and <= 3 drinks/occasion for females) and that the risk for alcohol disorders and other health effects rises proportionally with the number of drinks per week and how often  a drinker exceeds daily limits.  Discussed cessation/primary prevention of drug use and availability of treatment for abuse.   Diet: Encouraged to adjust caloric intake to maintain  or achieve ideal body weight, to reduce intake of dietary saturated fat and total fat, to limit sodium intake by avoiding high sodium foods and not adding table salt, and to maintain adequate dietary potassium and calcium preferably from fresh fruits, vegetables, and low-fat dairy products.    stressed the importance of regular exercise  Injury prevention: Discussed safety belts, safety helmets, smoke detector, smoking near bedding or upholstery.   Dental health: Discussed importance of regular tooth brushing, flossing, and dental visits.   Follow up plan: NEXT PREVENTATIVE PHYSICAL DUE IN 1 YEAR. Return in about 6 months (around 11/13/2020).

## 2020-05-14 NOTE — Assessment & Plan Note (Signed)
Under good control on current regimen. Continue current regimen. Continue to monitor. Call with any concerns. Refills given previously. Call with any concerns.

## 2020-05-14 NOTE — Patient Instructions (Signed)

## 2020-05-15 ENCOUNTER — Other Ambulatory Visit: Payer: Self-pay | Admitting: Family Medicine

## 2020-05-15 DIAGNOSIS — R7989 Other specified abnormal findings of blood chemistry: Secondary | ICD-10-CM

## 2020-05-15 LAB — CBC WITH DIFFERENTIAL/PLATELET
Basophils Absolute: 0 10*3/uL (ref 0.0–0.2)
Basos: 0 %
EOS (ABSOLUTE): 0.1 10*3/uL (ref 0.0–0.4)
Eos: 2 %
Hematocrit: 42.8 % (ref 37.5–51.0)
Hemoglobin: 15.3 g/dL (ref 13.0–17.7)
Immature Grans (Abs): 0 10*3/uL (ref 0.0–0.1)
Immature Granulocytes: 0 %
Lymphocytes Absolute: 1.9 10*3/uL (ref 0.7–3.1)
Lymphs: 28 %
MCH: 33 pg (ref 26.6–33.0)
MCHC: 35.7 g/dL (ref 31.5–35.7)
MCV: 92 fL (ref 79–97)
Monocytes Absolute: 0.5 10*3/uL (ref 0.1–0.9)
Monocytes: 8 %
Neutrophils Absolute: 4.2 10*3/uL (ref 1.4–7.0)
Neutrophils: 62 %
Platelets: 213 10*3/uL (ref 150–450)
RBC: 4.63 x10E6/uL (ref 4.14–5.80)
RDW: 12.5 % (ref 11.6–15.4)
WBC: 6.7 10*3/uL (ref 3.4–10.8)

## 2020-05-15 LAB — COMPREHENSIVE METABOLIC PANEL
ALT: 25 IU/L (ref 0–44)
AST: 21 IU/L (ref 0–40)
Albumin/Globulin Ratio: 1.8 (ref 1.2–2.2)
Albumin: 4.4 g/dL (ref 3.8–4.8)
Alkaline Phosphatase: 63 IU/L (ref 48–121)
BUN/Creatinine Ratio: 21 (ref 10–24)
BUN: 15 mg/dL (ref 8–27)
Bilirubin Total: 0.4 mg/dL (ref 0.0–1.2)
CO2: 20 mmol/L (ref 20–29)
Calcium: 9.3 mg/dL (ref 8.6–10.2)
Chloride: 104 mmol/L (ref 96–106)
Creatinine, Ser: 0.71 mg/dL — ABNORMAL LOW (ref 0.76–1.27)
GFR calc Af Amer: 115 mL/min/{1.73_m2} (ref >59)
GFR calc non Af Amer: 99 mL/min/{1.73_m2} (ref >59)
Globulin, Total: 2.4 g/dL (ref 1.5–4.5)
Glucose: 100 mg/dL — ABNORMAL HIGH (ref 65–99)
Potassium: 4.3 mmol/L (ref 3.5–5.2)
Sodium: 138 mmol/L (ref 134–144)
Total Protein: 6.8 g/dL (ref 6.0–8.5)

## 2020-05-15 LAB — LIPID PANEL W/O CHOL/HDL RATIO
Cholesterol, Total: 207 mg/dL — ABNORMAL HIGH (ref 100–199)
HDL: 41 mg/dL (ref >39)
LDL Chol Calc (NIH): 98 mg/dL (ref 0–99)
Triglycerides: 408 mg/dL — ABNORMAL HIGH (ref 0–149)
VLDL Cholesterol Cal: 68 mg/dL — ABNORMAL HIGH (ref 5–40)

## 2020-05-15 LAB — TSH: TSH: 0.188 u[IU]/mL — ABNORMAL LOW (ref 0.450–4.500)

## 2020-05-15 LAB — PSA: Prostate Specific Ag, Serum: 1.2 ng/mL (ref 0.0–4.0)

## 2020-06-13 DIAGNOSIS — Z03818 Encounter for observation for suspected exposure to other biological agents ruled out: Secondary | ICD-10-CM | POA: Diagnosis not present

## 2020-06-14 ENCOUNTER — Other Ambulatory Visit: Payer: Self-pay

## 2020-08-23 DIAGNOSIS — Z20822 Contact with and (suspected) exposure to covid-19: Secondary | ICD-10-CM | POA: Diagnosis not present

## 2020-08-23 DIAGNOSIS — U071 COVID-19: Secondary | ICD-10-CM | POA: Diagnosis not present

## 2020-11-20 ENCOUNTER — Ambulatory Visit: Payer: BC Managed Care – PPO | Admitting: Family Medicine

## 2021-01-09 ENCOUNTER — Other Ambulatory Visit: Payer: Self-pay

## 2021-01-09 ENCOUNTER — Ambulatory Visit (INDEPENDENT_AMBULATORY_CARE_PROVIDER_SITE_OTHER): Payer: BC Managed Care – PPO | Admitting: Family Medicine

## 2021-01-09 ENCOUNTER — Encounter: Payer: Self-pay | Admitting: Family Medicine

## 2021-01-09 VITALS — BP 122/80 | HR 60 | Temp 98.4°F | Wt 165.6 lb

## 2021-01-09 DIAGNOSIS — I129 Hypertensive chronic kidney disease with stage 1 through stage 4 chronic kidney disease, or unspecified chronic kidney disease: Secondary | ICD-10-CM

## 2021-01-09 DIAGNOSIS — E782 Mixed hyperlipidemia: Secondary | ICD-10-CM | POA: Diagnosis not present

## 2021-01-09 DIAGNOSIS — R7989 Other specified abnormal findings of blood chemistry: Secondary | ICD-10-CM

## 2021-01-09 MED ORDER — SILDENAFIL CITRATE 20 MG PO TABS: ORAL_TABLET | ORAL | 12 refills | Status: DC

## 2021-01-09 MED ORDER — LISINOPRIL 10 MG PO TABS: 10.0000 mg | ORAL_TABLET | Freq: Every day | ORAL | 1 refills | Status: DC

## 2021-01-09 NOTE — Assessment & Plan Note (Signed)
Under good control on current regimen. Continue current regimen. Continue to monitor. Call with any concerns. Refills given. Labs drawn today.   

## 2021-01-09 NOTE — Assessment & Plan Note (Signed)
Rechecking labs today. Await results. Treat as needed.  °

## 2021-01-09 NOTE — Progress Notes (Signed)
BP 122/80   Pulse 60   Temp 98.4 F (36.9 C)   Wt 165 lb 9.6 oz (75.1 kg)   SpO2 99%   BMI 26.33 kg/m    Subjective:    Patient ID: Philip Atkins, male    DOB: 1956-10-06, 65 y.o.   MRN: [id number]  HPI: Philip Atkins is a 65 y.o. male  Chief Complaint  Patient presents with  . Hyperlipidemia  . Erectile Dysfunction  . hypertensive renal disease    HYPERTENSION / HYPERLIPIDEMIA Satisfied with current treatment? yes Duration of hypertension: chronic BP monitoring frequency: not checking BP medication side effects: no Past BP meds: lisinopril Duration of hyperlipidemia: chronic Cholesterol medication side effects: not on anything Cholesterol supplements: none Past cholesterol medications: none Medication compliance: excellent compliance Aspirin: no Recent stressors: no Recurrent headaches: no Visual changes: no Palpitations: no Dyspnea: no Chest pain: no Lower extremity edema: no Dizzy/lightheaded: no   Relevant past medical, surgical, family and social history reviewed and updated as indicated. Interim medical history since our last visit reviewed. Allergies and medications reviewed and updated.  Review of Systems  Constitutional: Negative.   Respiratory: Negative.   Cardiovascular: Negative.   Gastrointestinal: Negative.   Musculoskeletal: Negative.   Neurological: Negative.   Psychiatric/Behavioral: Negative.     Per HPI unless specifically indicated above     Objective:    BP 122/80   Pulse 60   Temp 98.4 F (36.9 C)   Wt 165 lb 9.6 oz (75.1 kg)   SpO2 99%   BMI 26.33 kg/m   Wt Readings from Last 3 Encounters:  01/09/21 165 lb 9.6 oz (75.1 kg)  05/14/20 177 lb (80.3 kg)  09/26/19 172 lb 3.2 oz (78.1 kg)    Physical Exam Vitals and nursing note reviewed.  Constitutional:      General: He is not in acute distress.    Appearance: Normal appearance. He is not ill-appearing, toxic-appearing or diaphoretic.  HENT:     Head:  Normocephalic and atraumatic.     Right Ear: External ear normal.     Left Ear: External ear normal.     Nose: Nose normal.     Mouth/Throat:     Mouth: Mucous membranes are moist.     Pharynx: Oropharynx is clear.  Eyes:     General: No scleral icterus.       Right eye: No discharge.        Left eye: No discharge.     Extraocular Movements: Extraocular movements intact.     Conjunctiva/sclera: Conjunctivae normal.     Pupils: Pupils are equal, round, and reactive to light.  Cardiovascular:     Rate and Rhythm: Normal rate and regular rhythm.     Pulses: Normal pulses.     Heart sounds: Normal heart sounds. No murmur heard. No friction rub. No gallop.   Pulmonary:     Effort: Pulmonary effort is normal. No respiratory distress.     Breath sounds: Normal breath sounds. No stridor. No wheezing, rhonchi or rales.  Chest:     Chest wall: No tenderness.  Musculoskeletal:        General: Normal range of motion.     Cervical back: Normal range of motion and neck supple.  Skin:    General: Skin is warm and dry.     Capillary Refill: Capillary refill takes less than 2 seconds.     Coloration: Skin is not jaundiced or pale.     Findings: No bruising,  erythema, lesion or rash.  Neurological:     General: No focal deficit present.     Mental Status: He is alert and oriented to person, place, and time. Mental status is at baseline.  Psychiatric:        Mood and Affect: Mood normal.        Behavior: Behavior normal.        Thought Content: Thought content normal.        Judgment: Judgment normal.     Results for orders placed or performed in visit on 05/14/20  CBC with Differential/Platelet  Result Value Ref Range   WBC 6.7 3.4 - 10.8 x10E3/uL   RBC 4.63 4.14 - 5.80 x10E6/uL   Hemoglobin 15.3 13.0 - 17.7 g/dL   Hematocrit 42.8 37.5 - 51.0 %   MCV 92 79 - 97 fL   MCH 33.0 26.6 - 33.0 pg   MCHC 35.7 31.5 - 35.7 g/dL   RDW 12.5 11.6 - 15.4 %   Platelets 213 150 - 450 x10E3/uL    Neutrophils 62 Not Estab. %   Lymphs 28 Not Estab. %   Monocytes 8 Not Estab. %   Eos 2 Not Estab. %   Basos 0 Not Estab. %   Neutrophils Absolute 4.2 1.4 - 7.0 x10E3/uL   Lymphocytes Absolute 1.9 0.7 - 3.1 x10E3/uL   Monocytes Absolute 0.5 0.1 - 0.9 x10E3/uL   EOS (ABSOLUTE) 0.1 0.0 - 0.4 x10E3/uL   Basophils Absolute 0.0 0.0 - 0.2 x10E3/uL   Immature Granulocytes 0 Not Estab. %   Immature Grans (Abs) 0.0 0.0 - 0.1 x10E3/uL  Comprehensive metabolic panel  Result Value Ref Range   Glucose 100 (H) 65 - 99 mg/dL   BUN 15 8 - 27 mg/dL   Creatinine, Ser 0.71 (L) 0.76 - 1.27 mg/dL   GFR calc non Af Amer 99 >59 mL/min/1.73   GFR calc Af Amer 115 >59 mL/min/1.73   BUN/Creatinine Ratio 21 10 - 24   Sodium 138 134 - 144 mmol/L   Potassium 4.3 3.5 - 5.2 mmol/L   Chloride 104 96 - 106 mmol/L   CO2 20 20 - 29 mmol/L   Calcium 9.3 8.6 - 10.2 mg/dL   Total Protein 6.8 6.0 - 8.5 g/dL   Albumin 4.4 3.8 - 4.8 g/dL   Globulin, Total 2.4 1.5 - 4.5 g/dL   Albumin/Globulin Ratio 1.8 1.2 - 2.2   Bilirubin Total 0.4 0.0 - 1.2 mg/dL   Alkaline Phosphatase 63 48 - 121 IU/L   AST 21 0 - 40 IU/L   ALT 25 0 - 44 IU/L  Lipid Panel w/o Chol/HDL Ratio  Result Value Ref Range   Cholesterol, Total 207 (H) 100 - 199 mg/dL   Triglycerides 408 (H) 0 - 149 mg/dL   HDL 41 >39 mg/dL   VLDL Cholesterol Cal 68 (H) 5 - 40 mg/dL   LDL Chol Calc (NIH) 98 0 - 99 mg/dL  Microalbumin, Urine Waived  Result Value Ref Range   Microalb, Ur Waived 30 (H) 0 - 19 mg/L   Creatinine, Urine Waived 200 10 - 300 mg/dL   Microalb/Creat Ratio <30 <30 mg/g  PSA  Result Value Ref Range   Prostate Specific Ag, Serum 1.2 0.0 - 4.0 ng/mL  TSH  Result Value Ref Range   TSH 0.188 (L) 0.450 - 4.500 uIU/mL  Urinalysis, Routine w reflex microscopic  Result Value Ref Range   Specific Gravity, UA 1.025 1.005 - 1.030   pH, UA  5.5 5.0 - 7.5   Color, UA Yellow Yellow   Appearance Ur Clear Clear   Leukocytes,UA Negative Negative    Protein,UA Negative Negative/Trace   Glucose, UA Negative Negative   Ketones, UA Negative Negative   RBC, UA Negative Negative   Bilirubin, UA Negative Negative   Urobilinogen, Ur 0.2 0.2 - 1.0 mg/dL   Nitrite, UA Negative Negative      Assessment & Plan:   Problem List Items Addressed This Visit      Genitourinary   Benign hypertensive renal disease - Primary    Under good control on current regimen. Continue current regimen. Continue to monitor. Call with any concerns. Refills given. Labs drawn today.       Relevant Orders   Comprehensive metabolic panel     Other   Mixed hyperlipidemia    Rechecking labs today. Await results. Treat as needed.       Relevant Medications   lisinopril (ZESTRIL) 10 MG tablet   sildenafil (REVATIO) 20 MG tablet   Other Relevant Orders   Comprehensive metabolic panel   Lipid Panel w/o Chol/HDL Ratio    Other Visit Diagnoses    Abnormal thyroid blood test       Rechecking labs today. Await results.        Follow up plan: Return in about 6 months (around 07/09/2021) for physical.

## 2021-01-10 LAB — COMPREHENSIVE METABOLIC PANEL
ALT: 21 IU/L (ref 0–44)
AST: 24 IU/L (ref 0–40)
Albumin/Globulin Ratio: 2.5 — ABNORMAL HIGH (ref 1.2–2.2)
Albumin: 4.8 g/dL (ref 3.8–4.8)
Alkaline Phosphatase: 63 IU/L (ref 44–121)
BUN/Creatinine Ratio: 13 (ref 10–24)
BUN: 14 mg/dL (ref 8–27)
Bilirubin Total: 0.3 mg/dL (ref 0.0–1.2)
CO2: 22 mmol/L (ref 20–29)
Calcium: 10.2 mg/dL (ref 8.6–10.2)
Chloride: 101 mmol/L (ref 96–106)
Creatinine, Ser: 1.08 mg/dL (ref 0.76–1.27)
GFR calc Af Amer: 83 mL/min/{1.73_m2} (ref >59)
GFR calc non Af Amer: 72 mL/min/{1.73_m2} (ref >59)
Globulin, Total: 1.9 g/dL (ref 1.5–4.5)
Glucose: 108 mg/dL — ABNORMAL HIGH (ref 65–99)
Potassium: 5.1 mmol/L (ref 3.5–5.2)
Sodium: 140 mmol/L (ref 134–144)
Total Protein: 6.7 g/dL (ref 6.0–8.5)

## 2021-01-10 LAB — LIPID PANEL W/O CHOL/HDL RATIO
Cholesterol, Total: 198 mg/dL (ref 100–199)
HDL: 46 mg/dL (ref >39)
LDL Chol Calc (NIH): 120 mg/dL — ABNORMAL HIGH (ref 0–99)
Triglycerides: 182 mg/dL — ABNORMAL HIGH (ref 0–149)
VLDL Cholesterol Cal: 32 mg/dL (ref 5–40)

## 2021-01-10 LAB — TSH: TSH: 1.21 u[IU]/mL (ref 0.450–4.500)

## 2021-04-29 ENCOUNTER — Encounter: Payer: Self-pay | Admitting: Family Medicine

## 2021-05-29 ENCOUNTER — Encounter: Payer: BC Managed Care – PPO | Admitting: Family Medicine

## 2021-06-10 ENCOUNTER — Other Ambulatory Visit: Payer: Self-pay

## 2021-06-10 ENCOUNTER — Ambulatory Visit (INDEPENDENT_AMBULATORY_CARE_PROVIDER_SITE_OTHER): Payer: BC Managed Care – PPO | Admitting: Family Medicine

## 2021-06-10 ENCOUNTER — Encounter: Payer: Self-pay | Admitting: Family Medicine

## 2021-06-10 VITALS — BP 113/65 | HR 59 | Temp 98.7°F | Ht 67.0 in | Wt 158.2 lb

## 2021-06-10 DIAGNOSIS — Z720 Tobacco use: Secondary | ICD-10-CM

## 2021-06-10 DIAGNOSIS — E782 Mixed hyperlipidemia: Secondary | ICD-10-CM | POA: Diagnosis not present

## 2021-06-10 DIAGNOSIS — Z0000 Encounter for general adult medical examination without abnormal findings: Secondary | ICD-10-CM | POA: Diagnosis not present

## 2021-06-10 DIAGNOSIS — I129 Hypertensive chronic kidney disease with stage 1 through stage 4 chronic kidney disease, or unspecified chronic kidney disease: Secondary | ICD-10-CM

## 2021-06-10 LAB — URINALYSIS, ROUTINE W REFLEX MICROSCOPIC
Bilirubin, UA: NEGATIVE
Glucose, UA: NEGATIVE
Ketones, UA: NEGATIVE
Leukocytes,UA: NEGATIVE
Nitrite, UA: NEGATIVE
Protein,UA: NEGATIVE
RBC, UA: NEGATIVE
Specific Gravity, UA: 1.025 (ref 1.005–1.030)
Urobilinogen, Ur: 0.2 mg/dL (ref 0.2–1.0)
pH, UA: 6 (ref 5.0–7.5)

## 2021-06-10 LAB — MICROALBUMIN, URINE WAIVED
Creatinine, Urine Waived: 200 mg/dL (ref 10–300)
Microalb, Ur Waived: 10 mg/L (ref 0–19)
Microalb/Creat Ratio: <30 mg/g (ref <30)

## 2021-06-10 MED ORDER — SILDENAFIL CITRATE 20 MG PO TABS: ORAL_TABLET | ORAL | 12 refills | Status: DC

## 2021-06-10 MED ORDER — LISINOPRIL 10 MG PO TABS: 10.0000 mg | ORAL_TABLET | Freq: Every day | ORAL | 1 refills | Status: DC

## 2021-06-10 NOTE — Assessment & Plan Note (Signed)
Under good control on current regimen. Continue current regimen. Continue to monitor. Call with any concerns. Refills given. Labs drawn today.   

## 2021-06-10 NOTE — Assessment & Plan Note (Signed)
Rechecking labs today. Await results. Treat as needed.  °

## 2021-06-10 NOTE — Progress Notes (Signed)
BP 113/65   Pulse (!) 59   Temp 98.7 F (37.1 C) (Oral)   Ht '5\' 7"'$  (1.702 m)   Wt 158 lb 3.2 oz (71.8 kg)   SpO2 97%   BMI 24.78 kg/m    Subjective:    Patient ID: Philip Atkins, male    DOB: 1956/07/12, 65 y.o.   MRN: [id number]  HPI: Philip Atkins is a 65 y.o. male presenting on 06/10/2021 for comprehensive medical examination. Current medical complaints include:  HYPERTENSION / HYPERLIPIDEMIA Satisfied with current treatment? yes Duration of hypertension: chronic BP monitoring frequency: not checking BP medication side effects: no Past BP meds: lisinopril Duration of hyperlipidemia: chronic Cholesterol medication side effects: not on anything Cholesterol supplements: none Past cholesterol medications: none Medication compliance: excellent compliance Aspirin: no Recent stressors: no Recurrent headaches: no Visual changes: no Palpitations: no Dyspnea: no Chest pain: no Lower extremity edema: no Dizzy/lightheaded: no   He currently lives with: wife Interim Problems from his last visit: no  Depression Screen done today and results listed below:  Depression screen [facility] 2/9 06/10/2021 05/14/2020 09/26/2019 09/21/2018 09/18/2017  Decreased Interest 0 0 0 0 0  Down, Depressed, Hopeless 0 0 0 0 0  PHQ - 2 Score 0 0 0 0 0  Altered sleeping - 1 - 0 -  Tired, decreased energy - 1 - 1 -  Change in appetite - 0 - 0 -  Feeling bad or failure about yourself  - 0 - 0 -  Trouble concentrating - 0 - 0 -  Moving slowly or fidgety/restless - 0 - 0 -  Suicidal thoughts - 0 - 0 -  PHQ-9 Score - 2 - 1 -  Difficult doing work/chores - Not difficult at all - Not difficult at all -    Past Medical History:  Past Medical History:  Diagnosis Date   Abscess of right buttock 11/03/2015   Abscess of right hip 11/03/2015   ED (erectile dysfunction)    Gastric ulceration    Hypertension    Migraines    MRSA (methicillin resistant Staphylococcus aureus) June 2016   Neoplasm of  uncertain behavior    Neoplasm of uncertain behavior    Squamous cell carcinoma 06/2013    Surgical History:  Past Surgical History:  Procedure Laterality Date   COLONOSCOPY  08/08/11   1 benign polyp; one tubular adenoma; repeat 5 years   COLONOSCOPY WITH PROPOFOL N/A 10/05/2018   Procedure: COLONOSCOPY WITH PROPOFOL;  Surgeon: Jonathon Bellows, MD;  Location: [facility] ENDOSCOPY;  Service: Gastroenterology;  Laterality: N/A;   FINGER TENDON REPAIR      Medications:  Current Outpatient Medications on File Prior to Visit  Medication Sig   acetaminophen (TYLENOL) 500 MG tablet Take 500 mg by mouth 2 (two) times daily.   Multiple Vitamin (MULTIVITAMIN) tablet Take 1 tablet by mouth daily.   No current facility-administered medications on file prior to visit.    Allergies:  No Known Allergies  Social History:  Social History   Socioeconomic History   Marital status: Married    Spouse name: Not on file   Number of children: Not on file   Years of education: Not on file   Highest education level: Not on file  Occupational History   Not on file  Tobacco Use   Smoking status: Every Day    Packs/day: 0.25    Types: Cigarettes   Smokeless tobacco: Never  Vaping Use   Vaping Use: Some days  Substance and Sexual  Activity   Alcohol use: No   Drug use: No   Sexual activity: Yes  Other Topics Concern   Not on file  Social History Narrative   Not on file   Social Determinants of Health   Financial Resource Strain: Not on file  Food Insecurity: Not on file  Transportation Needs: Not on file  Physical Activity: Not on file  Stress: Not on file  Social Connections: Not on file  Intimate Partner Violence: Not on file   Social History   Tobacco Use  Smoking Status Every Day   Packs/day: 0.25   Types: Cigarettes  Smokeless Tobacco Never   Social History   Substance and Sexual Activity  Alcohol Use No    Family History:  Family History  Problem Relation Age of Onset    Heart attack Mother    Hypertension Mother    Heart disease Mother    Diabetes Brother    Asthma Brother    Heart disease Maternal Grandmother    Cancer Neg Hx    COPD Neg Hx    Stroke Neg Hx     Past medical history, surgical history, medications, allergies, family history and social history reviewed with patient today and changes made to appropriate areas of the chart.   Review of Systems  Constitutional: Negative.   HENT: Negative.    Eyes: Negative.   Cardiovascular: Negative.   Gastrointestinal: Negative.   Genitourinary: Negative.   Musculoskeletal: Negative.   Skin: Negative.   Neurological: Negative.   Endo/Heme/Allergies:  Negative for environmental allergies and polydipsia. Bruises/bleeds easily.  Psychiatric/Behavioral: Negative.    All other ROS negative except what is listed above and in the HPI.      Objective:    BP 113/65   Pulse (!) 59   Temp 98.7 F (37.1 C) (Oral)   Ht '5\' 7"'$  (1.702 m)   Wt 158 lb 3.2 oz (71.8 kg)   SpO2 97%   BMI 24.78 kg/m   Wt Readings from Last 3 Encounters:  06/10/21 158 lb 3.2 oz (71.8 kg)  01/09/21 165 lb 9.6 oz (75.1 kg)  05/14/20 177 lb (80.3 kg)    Physical Exam Vitals and nursing note reviewed.  Constitutional:      General: He is not in acute distress.    Appearance: Normal appearance. He is obese. He is not ill-appearing, toxic-appearing or diaphoretic.  HENT:     Head: Normocephalic and atraumatic.     Right Ear: Tympanic membrane, ear canal and external ear normal. There is no impacted cerumen.     Left Ear: Tympanic membrane, ear canal and external ear normal. There is no impacted cerumen.     Nose: Nose normal. No congestion or rhinorrhea.     Mouth/Throat:     Mouth: Mucous membranes are moist.     Pharynx: Oropharynx is clear. No oropharyngeal exudate or posterior oropharyngeal erythema.  Eyes:     General: No scleral icterus.       Right eye: No discharge.        Left eye: No discharge.     Extraocular  Movements: Extraocular movements intact.     Conjunctiva/sclera: Conjunctivae normal.     Pupils: Pupils are equal, round, and reactive to light.  Neck:     Vascular: No carotid bruit.  Cardiovascular:     Rate and Rhythm: Normal rate and regular rhythm.     Pulses: Normal pulses.     Heart sounds: No murmur heard.  No friction rub. No gallop.  Pulmonary:     Effort: Pulmonary effort is normal. No respiratory distress.     Breath sounds: Normal breath sounds. No stridor. No wheezing, rhonchi or rales.  Chest:     Chest wall: No tenderness.  Abdominal:     General: Abdomen is flat. Bowel sounds are normal. There is no distension.     Palpations: Abdomen is soft. There is no mass.     Tenderness: There is no abdominal tenderness. There is no right CVA tenderness, left CVA tenderness, guarding or rebound.     Hernia: No hernia is present.  Genitourinary:    Comments: Genital exam deferred with shared decision making Musculoskeletal:        General: No swelling, tenderness, deformity or signs of injury.     Cervical back: Normal range of motion and neck supple. No rigidity. No muscular tenderness.     Right lower leg: No edema.     Left lower leg: No edema.  Lymphadenopathy:     Cervical: No cervical adenopathy.  Skin:    General: Skin is warm and dry.     Capillary Refill: Capillary refill takes less than 2 seconds.     Coloration: Skin is not jaundiced or pale.     Findings: No bruising, erythema, lesion or rash.  Neurological:     General: No focal deficit present.     Mental Status: He is alert and oriented to person, place, and time.     Cranial Nerves: No cranial nerve deficit.     Sensory: No sensory deficit.     Motor: No weakness.     Coordination: Coordination normal.     Gait: Gait normal.     Deep Tendon Reflexes: Reflexes normal.  Psychiatric:        Mood and Affect: Mood normal.        Behavior: Behavior normal.        Thought Content: Thought content normal.         Judgment: Judgment normal.    Results for orders placed or performed in visit on 01/09/21  TSH  Result Value Ref Range   TSH 1.210 0.450 - 4.500 uIU/mL  Comprehensive metabolic panel  Result Value Ref Range   Glucose 108 (H) 65 - 99 mg/dL   BUN 14 8 - 27 mg/dL   Creatinine, Ser 1.08 0.76 - 1.27 mg/dL   GFR calc non Af Amer 72 >59 mL/min/1.73   GFR calc Af Amer 83 >59 mL/min/1.73   BUN/Creatinine Ratio 13 10 - 24   Sodium 140 134 - 144 mmol/L   Potassium 5.1 3.5 - 5.2 mmol/L   Chloride 101 96 - 106 mmol/L   CO2 22 20 - 29 mmol/L   Calcium 10.2 8.6 - 10.2 mg/dL   Total Protein 6.7 6.0 - 8.5 g/dL   Albumin 4.8 3.8 - 4.8 g/dL   Globulin, Total 1.9 1.5 - 4.5 g/dL   Albumin/Globulin Ratio 2.5 (H) 1.2 - 2.2   Bilirubin Total 0.3 0.0 - 1.2 mg/dL   Alkaline Phosphatase 63 44 - 121 IU/L   AST 24 0 - 40 IU/L   ALT 21 0 - 44 IU/L  Lipid Panel w/o Chol/HDL Ratio  Result Value Ref Range   Cholesterol, Total 198 100 - 199 mg/dL   Triglycerides 182 (H) 0 - 149 mg/dL   HDL 46 >39 mg/dL   VLDL Cholesterol Cal 32 5 - 40 mg/dL   LDL Chol Calc (NIH)  120 (H) 0 - 99 mg/dL      Assessment & Plan:   Problem List Items Addressed This Visit       Genitourinary   Benign hypertensive renal disease    Under good control on current regimen. Continue current regimen. Continue to monitor. Call with any concerns. Refills given. Labs drawn today.        Relevant Orders   Microalbumin, Urine Waived   Comprehensive metabolic panel     Other   Tobacco abuse    Needs AAA screening- ordered today. Await results.        Relevant Orders   US AORTA DUPLEX LIMITED   Mixed hyperlipidemia    Rechecking labs today. Await results. Treat as needed.        Relevant Medications   sildenafil (REVATIO) 20 MG tablet   lisinopril (ZESTRIL) 10 MG tablet   Other Relevant Orders   Comprehensive metabolic panel   Lipid Panel w/o Chol/HDL Ratio   Other Visit Diagnoses     Routine general  medical examination at a health care facility    -  Primary   Vaccines up to date/declined. Screening labs checked today. Colonoscopy due in November. AAA screening ordered. Continue diet 7 exercise. Call with any concerns   Relevant Orders   Microalbumin, Urine Waived   Comprehensive metabolic panel   CBC with Differential/Platelet   Lipid Panel w/o Chol/HDL Ratio   PSA   TSH   Urinalysis, Routine w reflex microscopic        Discussed aspirin prophylaxis for myocardial infarction prevention and decision was made to continue ASA  LABORATORY TESTING:  Health maintenance labs ordered today as discussed above.   The natural history of prostate cancer and ongoing controversy regarding screening and potential treatment outcomes of prostate cancer has been discussed with the patient. The meaning of a false positive PSA and a false negative PSA has been discussed. He indicates understanding of the limitations of this screening test and wishes to proceed with screening PSA testing.   IMMUNIZATIONS:   - Tdap: Tetanus vaccination status reviewed: last tetanus booster within 10 years. - Influenza: Postponed to flu season - Pneumovax: Refused - Prevnar: Refused - COVID: Up to date - Shingrix vaccine: Refused  SCREENING: - Colonoscopy: Up to date  Discussed with patient purpose of the colonoscopy is to detect colon cancer at curable precancerous or early stages   - AAA Screening: Ordered today   PATIENT COUNSELING:    Sexuality: Discussed sexually transmitted diseases, partner selection, use of condoms, avoidance of unintended pregnancy  and contraceptive alternatives.   Advised to avoid cigarette smoking.  I discussed with the patient that most people either abstain from alcohol or drink within safe limits (<=14/week and <=4 drinks/occasion for males, <=7/weeks and <= 3 drinks/occasion for females) and that the risk for alcohol disorders and other health effects rises proportionally  with the number of drinks per week and how often a drinker exceeds daily limits.  Discussed cessation/primary prevention of drug use and availability of treatment for abuse.   Diet: Encouraged to adjust caloric intake to maintain  or achieve ideal body weight, to reduce intake of dietary saturated fat and total fat, to limit sodium intake by avoiding high sodium foods and not adding table salt, and to maintain adequate dietary potassium and calcium preferably from fresh fruits, vegetables, and low-fat dairy products.    stressed the importance of regular exercise  Injury prevention: Discussed safety belts, safety helmets, smoke detector,  smoking near bedding or upholstery.   Dental health: Discussed importance of regular tooth brushing, flossing, and dental visits.   Follow up plan: NEXT PREVENTATIVE PHYSICAL DUE IN 1 YEAR. Return in about 6 months (around 12/11/2021).

## 2021-06-10 NOTE — Assessment & Plan Note (Signed)
Needs AAA screening- ordered today. Await results.

## 2021-06-11 LAB — TSH: TSH: 1.06 u[IU]/mL (ref 0.450–4.500)

## 2021-06-11 LAB — CBC WITH DIFFERENTIAL/PLATELET
Basophils Absolute: 0.1 10*3/uL (ref 0.0–0.2)
Basos: 1 %
EOS (ABSOLUTE): 0.1 10*3/uL (ref 0.0–0.4)
Eos: 1 %
Hematocrit: 44 % (ref 37.5–51.0)
Hemoglobin: 15 g/dL (ref 13.0–17.7)
Immature Grans (Abs): 0 10*3/uL (ref 0.0–0.1)
Immature Granulocytes: 0 %
Lymphocytes Absolute: 1.9 10*3/uL (ref 0.7–3.1)
Lymphs: 26 %
MCH: 32.5 pg (ref 26.6–33.0)
MCHC: 34.1 g/dL (ref 31.5–35.7)
MCV: 95 fL (ref 79–97)
Monocytes Absolute: 0.5 10*3/uL (ref 0.1–0.9)
Monocytes: 6 %
Neutrophils Absolute: 4.9 10*3/uL (ref 1.4–7.0)
Neutrophils: 66 %
Platelets: 197 10*3/uL (ref 150–450)
RBC: 4.61 x10E6/uL (ref 4.14–5.80)
RDW: 12.5 % (ref 11.6–15.4)
WBC: 7.4 10*3/uL (ref 3.4–10.8)

## 2021-06-11 LAB — COMPREHENSIVE METABOLIC PANEL
ALT: 17 IU/L (ref 0–44)
AST: 19 IU/L (ref 0–40)
Albumin/Globulin Ratio: 2.1 (ref 1.2–2.2)
Albumin: 4.5 g/dL (ref 3.8–4.8)
Alkaline Phosphatase: 62 IU/L (ref 44–121)
BUN/Creatinine Ratio: 15 (ref 10–24)
BUN: 14 mg/dL (ref 8–27)
Bilirubin Total: 0.4 mg/dL (ref 0.0–1.2)
CO2: 23 mmol/L (ref 20–29)
Calcium: 9.6 mg/dL (ref 8.6–10.2)
Chloride: 103 mmol/L (ref 96–106)
Creatinine, Ser: 0.95 mg/dL (ref 0.76–1.27)
Globulin, Total: 2.1 g/dL (ref 1.5–4.5)
Glucose: 90 mg/dL (ref 65–99)
Potassium: 4.1 mmol/L (ref 3.5–5.2)
Sodium: 141 mmol/L (ref 134–144)
Total Protein: 6.6 g/dL (ref 6.0–8.5)
eGFR: 89 mL/min/{1.73_m2} (ref >59)

## 2021-06-11 LAB — LIPID PANEL W/O CHOL/HDL RATIO
Cholesterol, Total: 189 mg/dL (ref 100–199)
HDL: 49 mg/dL (ref >39)
LDL Chol Calc (NIH): 111 mg/dL — ABNORMAL HIGH (ref 0–99)
Triglycerides: 165 mg/dL — ABNORMAL HIGH (ref 0–149)
VLDL Cholesterol Cal: 29 mg/dL (ref 5–40)

## 2021-06-11 LAB — PSA: Prostate Specific Ag, Serum: 1.2 ng/mL (ref 0.0–4.0)

## 2021-12-12 ENCOUNTER — Ambulatory Visit: Payer: BC Managed Care – PPO | Admitting: Family Medicine

## 2021-12-20 ENCOUNTER — Other Ambulatory Visit: Payer: Self-pay

## 2021-12-20 ENCOUNTER — Encounter: Payer: Self-pay | Admitting: Family Medicine

## 2021-12-20 ENCOUNTER — Ambulatory Visit (INDEPENDENT_AMBULATORY_CARE_PROVIDER_SITE_OTHER): Payer: BC Managed Care – PPO | Admitting: Family Medicine

## 2021-12-20 VITALS — BP 117/72 | HR 64 | Temp 98.2°F | Ht 67.0 in | Wt 156.2 lb

## 2021-12-20 DIAGNOSIS — G2581 Restless legs syndrome: Secondary | ICD-10-CM | POA: Diagnosis not present

## 2021-12-20 DIAGNOSIS — Z125 Encounter for screening for malignant neoplasm of prostate: Secondary | ICD-10-CM | POA: Diagnosis not present

## 2021-12-20 DIAGNOSIS — Z0000 Encounter for general adult medical examination without abnormal findings: Secondary | ICD-10-CM

## 2021-12-20 DIAGNOSIS — Z720 Tobacco use: Secondary | ICD-10-CM | POA: Diagnosis not present

## 2021-12-20 DIAGNOSIS — D692 Other nonthrombocytopenic purpura: Secondary | ICD-10-CM

## 2021-12-20 DIAGNOSIS — I129 Hypertensive chronic kidney disease with stage 1 through stage 4 chronic kidney disease, or unspecified chronic kidney disease: Secondary | ICD-10-CM | POA: Diagnosis not present

## 2021-12-20 DIAGNOSIS — E782 Mixed hyperlipidemia: Secondary | ICD-10-CM | POA: Diagnosis not present

## 2021-12-20 DIAGNOSIS — Z1211 Encounter for screening for malignant neoplasm of colon: Secondary | ICD-10-CM

## 2021-12-20 LAB — URINALYSIS, ROUTINE W REFLEX MICROSCOPIC
Bilirubin, UA: NEGATIVE
Glucose, UA: NEGATIVE
Leukocytes,UA: NEGATIVE
Nitrite, UA: NEGATIVE
Protein,UA: NEGATIVE
RBC, UA: NEGATIVE
Specific Gravity, UA: >1.03 — ABNORMAL HIGH (ref 1.005–1.030)
Urobilinogen, Ur: 1 mg/dL (ref 0.2–1.0)
pH, UA: 6 (ref 5.0–7.5)

## 2021-12-20 LAB — MICROALBUMIN, URINE WAIVED
Creatinine, Urine Waived: 300 mg/dL (ref 10–300)
Microalb, Ur Waived: 30 mg/L — ABNORMAL HIGH (ref 0–19)
Microalb/Creat Ratio: <30 mg/g (ref <30)

## 2021-12-20 MED ORDER — LISINOPRIL 10 MG PO TABS: 10.0000 mg | ORAL_TABLET | Freq: Every day | ORAL | 1 refills | Status: DC

## 2021-12-20 MED ORDER — SILDENAFIL CITRATE 20 MG PO TABS: ORAL_TABLET | ORAL | 12 refills | Status: DC

## 2021-12-20 NOTE — Assessment & Plan Note (Signed)
Checking labs today. Will let us know if he wants help quitting.

## 2021-12-20 NOTE — Assessment & Plan Note (Signed)
Under good control on current regimen. Continue current regimen. Continue to monitor. Call with any concerns. Refills given. Labs drawn today.   

## 2021-12-20 NOTE — Assessment & Plan Note (Signed)
Reassured patient. Continue to monitor.  

## 2021-12-20 NOTE — Assessment & Plan Note (Signed)
Checking labs today. Await results. If normal, consider requip.

## 2021-12-20 NOTE — Progress Notes (Signed)
BP 117/72    Pulse 64    Temp 98.2 F (36.8 C)    Ht _0  (1.702 m)    Wt 156 lb 3.2 oz (70.9 kg)    SpO2 97%    BMI 24.46 kg/m    Subjective:    Patient ID: Philip Atkins, male    DOB: [birth year], 66 y.o.   MRN: [id number]  HPI: Philip Atkins is a 66 y.o. male presenting on 12/20/2021 for comprehensive medical examination. Current medical complaints include:  HYPERTENSION / HYPERLIPIDEMIA Satisfied with current treatment? yes Duration of hypertension: chronic BP monitoring frequency: not checking BP medication side effects: no Past BP meds: lisinopril Duration of hyperlipidemia: chronic Cholesterol medication side effects: not on anyting Cholesterol supplements: none Past cholesterol medications: none Medication compliance: excellent compliance Aspirin: no Recent stressors: yes Recurrent headaches: no Visual changes: no Palpitations: no Dyspnea: no Chest pain: no Lower extremity edema: no Dizzy/lightheaded: no  He currently lives with: wife Interim Problems from his last visit: no  Depression Screen done today and results listed below:  Depression screen [facility] 2/9 12/20/2021 06/10/2021 05/14/2020 09/26/2019 09/21/2018  Decreased Interest 0 0 0 0 0  Down, Depressed, Hopeless 1 0 0 0 0  PHQ - 2 Score 1 0 0 0 0  Altered sleeping 1 - 1 - 0  Tired, decreased energy 1 - 1 - 1  Change in appetite 0 - 0 - 0  Feeling bad or failure about yourself  0 - 0 - 0  Trouble concentrating 0 - 0 - 0  Moving slowly or fidgety/restless 0 - 0 - 0  Suicidal thoughts 0 - 0 - 0  PHQ-9 Score 3 - 2 - 1  Difficult doing work/chores - - Not difficult at all - Not difficult at all   Past Medical History:  Past Medical History:  Diagnosis Date   Abscess of right buttock 11/03/2015   Abscess of right hip 11/03/2015   ED (erectile dysfunction)    Gastric ulceration    Hypertension    Migraines    MRSA (methicillin resistant Staphylococcus aureus) June 2016   Neoplasm of uncertain behavior     Neoplasm of uncertain behavior    Squamous cell carcinoma 06/2013    Surgical History:  Past Surgical History:  Procedure Laterality Date   COLONOSCOPY  08/08/11   1 benign polyp; one tubular adenoma; repeat 5 years   COLONOSCOPY WITH PROPOFOL N/A 10/05/2018   Procedure: COLONOSCOPY WITH PROPOFOL;  Surgeon: Jonathon Bellows, MD;  Location: [facility];  Service: Gastroenterology;  Laterality: N/A;   FINGER TENDON REPAIR      Medications:  Current Outpatient Medications on File Prior to Visit  Medication Sig   acetaminophen (TYLENOL) 500 MG tablet Take 500 mg by mouth 2 (two) times daily.   Multiple Vitamin (MULTIVITAMIN) tablet Take 1 tablet by mouth daily.   No current facility-administered medications on file prior to visit.    Allergies:  No Known Allergies  Social History:  Social History   Socioeconomic History   Marital status: Married    Spouse name: Not on file   Number of children: Not on file   Years of education: Not on file   Highest education level: Not on file  Occupational History   Not on file  Tobacco Use   Smoking status: Every Day    Packs/day: 0.25    Types: Cigarettes   Smokeless tobacco: Never  Vaping Use   Vaping Use: Some days  Substance  and Sexual Activity   Alcohol use: No   Drug use: No   Sexual activity: Yes  Other Topics Concern   Not on file  Social History Narrative   Not on file   Social Determinants of Health   Financial Resource Strain: Not on file  Food Insecurity: Not on file  Transportation Needs: Not on file  Physical Activity: Not on file  Stress: Not on file  Social Connections: Not on file  Intimate Partner Violence: Not on file   Social History   Tobacco Use  Smoking Status Every Day   Packs/day: 0.25   Types: Cigarettes  Smokeless Tobacco Never   Social History   Substance and Sexual Activity  Alcohol Use No    Family History:  Family History  Problem Relation Age of Onset   Heart attack Mother     Hypertension Mother    Heart disease Mother    Diabetes Brother    Asthma Brother    Heart disease Maternal Grandmother    Cancer Neg Hx    COPD Neg Hx    Stroke Neg Hx     Past medical history, surgical history, medications, allergies, family history and social history reviewed with patient today and changes made to appropriate areas of the chart.   Review of Systems  Constitutional: Negative.   HENT: Negative.    Eyes: Negative.   Respiratory: Negative.    Cardiovascular: Negative.   Gastrointestinal:  Positive for heartburn (with food choices and nerves). Negative for abdominal pain, blood in stool, constipation, diarrhea, melena, nausea and vomiting.  Genitourinary: Negative.   Musculoskeletal: Negative.   Skin: Negative.   Neurological: Negative.   Endo/Heme/Allergies:  Positive for environmental allergies. Negative for polydipsia. Bruises/bleeds easily.  Psychiatric/Behavioral: Negative.    All other ROS negative except what is listed above and in the HPI.      Objective:    BP 117/72    Pulse 64    Temp 98.2 F (36.8 C)    Ht _0  (1.702 m)    Wt 156 lb 3.2 oz (70.9 kg)    SpO2 97%    BMI 24.46 kg/m   Wt Readings from Last 3 Encounters:  12/20/21 156 lb 3.2 oz (70.9 kg)  06/10/21 158 lb 3.2 oz (71.8 kg)  01/09/21 165 lb 9.6 oz (75.1 kg)    Physical Exam Vitals and nursing note reviewed.  Constitutional:      General: He is not in acute distress.    Appearance: Normal appearance. He is obese. He is not ill-appearing, toxic-appearing or diaphoretic.  HENT:     Head: Normocephalic and atraumatic.     Right Ear: Tympanic membrane, ear canal and external ear normal. There is no impacted cerumen.     Left Ear: Tympanic membrane, ear canal and external ear normal. There is no impacted cerumen.     Nose: Nose normal. No congestion or rhinorrhea.     Mouth/Throat:     Mouth: Mucous membranes are moist.     Pharynx: Oropharynx is clear. No oropharyngeal exudate or  posterior oropharyngeal erythema.  Eyes:     General: No scleral icterus.       Right eye: No discharge.        Left eye: No discharge.     Extraocular Movements: Extraocular movements intact.     Conjunctiva/sclera: Conjunctivae normal.     Pupils: Pupils are equal, round, and reactive to light.  Neck:     Vascular: No  carotid bruit.  Cardiovascular:     Rate and Rhythm: Normal rate and regular rhythm.     Pulses: Normal pulses.     Heart sounds: No murmur heard.   No friction rub. No gallop.  Pulmonary:     Effort: Pulmonary effort is normal. No respiratory distress.     Breath sounds: Normal breath sounds. No stridor. No wheezing, rhonchi or rales.  Chest:     Chest wall: No tenderness.  Abdominal:     General: Abdomen is flat. Bowel sounds are normal. There is no distension.     Palpations: Abdomen is soft. There is no mass.     Tenderness: There is no abdominal tenderness. There is no right CVA tenderness, left CVA tenderness, guarding or rebound.     Hernia: No hernia is present.  Genitourinary:    Comments: Genital exam deferred with shared decision making Musculoskeletal:        General: No swelling, tenderness, deformity or signs of injury.     Cervical back: Normal range of motion and neck supple. No rigidity. No muscular tenderness.     Right lower leg: No edema.     Left lower leg: No edema.  Lymphadenopathy:     Cervical: No cervical adenopathy.  Skin:    General: Skin is warm and dry.     Capillary Refill: Capillary refill takes less than 2 seconds.     Coloration: Skin is not jaundiced or pale.     Findings: No bruising, erythema, lesion or rash.  Neurological:     General: No focal deficit present.     Mental Status: He is alert and oriented to person, place, and time.     Cranial Nerves: No cranial nerve deficit.     Sensory: No sensory deficit.     Motor: No weakness.     Coordination: Coordination normal.     Gait: Gait normal.     Deep Tendon  Reflexes: Reflexes normal.  Psychiatric:        Mood and Affect: Mood normal.        Behavior: Behavior normal.        Thought Content: Thought content normal.        Judgment: Judgment normal.    Results for orders placed or performed in visit on 06/10/21  Microalbumin, Urine Waived  Result Value Ref Range   Microalb, Ur Waived 10 0 - 19 mg/L   Creatinine, Urine Waived 200 10 - 300 mg/dL   Microalb/Creat Ratio <30 <30 mg/g  CBC with Differential/Platelet  Result Value Ref Range   WBC 7.4 3.4 - 10.8 x10E3/uL   RBC 4.61 4.14 - 5.80 x10E6/uL   Hemoglobin 15.0 13.0 - 17.7 g/dL   Hematocrit 44.0 37.5 - 51.0 %   MCV 95 79 - 97 fL   MCH 32.5 26.6 - 33.0 pg   MCHC 34.1 31.5 - 35.7 g/dL   RDW 12.5 11.6 - 15.4 %   Platelets 197 150 - 450 x10E3/uL   Neutrophils 66 Not Estab. %   Lymphs 26 Not Estab. %   Monocytes 6 Not Estab. %   Eos 1 Not Estab. %   Basos 1 Not Estab. %   Neutrophils Absolute 4.9 1.4 - 7.0 x10E3/uL   Lymphocytes Absolute 1.9 0.7 - 3.1 x10E3/uL   Monocytes Absolute 0.5 0.1 - 0.9 x10E3/uL   EOS (ABSOLUTE) 0.1 0.0 - 0.4 x10E3/uL   Basophils Absolute 0.1 0.0 - 0.2 x10E3/uL   Immature Granulocytes 0 Not  Estab. %   Immature Grans (Abs) 0.0 0.0 - 0.1 x10E3/uL  Lipid Panel w/o Chol/HDL Ratio  Result Value Ref Range   Cholesterol, Total 189 100 - 199 mg/dL   Triglycerides 165 (H) 0 - 149 mg/dL   HDL 49 >39 mg/dL   VLDL Cholesterol Cal 29 5 - 40 mg/dL   LDL Chol Calc (NIH) 111 (H) 0 - 99 mg/dL  PSA  Result Value Ref Range   Prostate Specific Ag, Serum 1.2 0.0 - 4.0 ng/mL  TSH  Result Value Ref Range   TSH 1.060 0.450 - 4.500 uIU/mL  Urinalysis, Routine w reflex microscopic  Result Value Ref Range   Specific Gravity, UA 1.025 1.005 - 1.030   pH, UA 6.0 5.0 - 7.5   Color, UA Yellow Yellow   Appearance Ur Clear Clear   Leukocytes,UA Negative Negative   Protein,UA Negative Negative/Trace   Glucose, UA Negative Negative   Ketones, UA Negative Negative   RBC, UA  Negative Negative   Bilirubin, UA Negative Negative   Urobilinogen, Ur 0.2 0.2 - 1.0 mg/dL   Nitrite, UA Negative Negative  Comprehensive metabolic panel  Result Value Ref Range   Glucose 90 65 - 99 mg/dL   BUN 14 8 - 27 mg/dL   Creatinine, Ser 0.95 0.76 - 1.27 mg/dL   eGFR 89 >59 mL/min/1.73   BUN/Creatinine Ratio 15 10 - 24   Sodium 141 134 - 144 mmol/L   Potassium 4.1 3.5 - 5.2 mmol/L   Chloride 103 96 - 106 mmol/L   CO2 23 20 - 29 mmol/L   Calcium 9.6 8.6 - 10.2 mg/dL   Total Protein 6.6 6.0 - 8.5 g/dL   Albumin 4.5 3.8 - 4.8 g/dL   Globulin, Total 2.1 1.5 - 4.5 g/dL   Albumin/Globulin Ratio 2.1 1.2 - 2.2   Bilirubin Total 0.4 0.0 - 1.2 mg/dL   Alkaline Phosphatase 62 44 - 121 IU/L   AST 19 0 - 40 IU/L   ALT 17 0 - 44 IU/L      Assessment & Plan:   Problem List Items Addressed This Visit       Cardiovascular and Mediastinum   Senile purpura ([place])    Reassured patient. Continue to monitor.       Relevant Medications   sildenafil (REVATIO) 20 MG tablet   lisinopril (ZESTRIL) 10 MG tablet     Genitourinary   Benign hypertensive renal disease    Under good control on current regimen. Continue current regimen. Continue to monitor. Call with any concerns. Refills given. Labs drawn today.       Relevant Orders   Comprehensive metabolic panel   CBC with Differential/Platelet   TSH   Microalbumin, Urine Waived     Other   Tobacco abuse    Checking labs today. Will let us know if he wants help quitting.       Relevant Orders   Comprehensive metabolic panel   CBC with Differential/Platelet   Urinalysis, Routine w reflex microscopic   Mixed hyperlipidemia    Rechecking labs today. Await results. Treat as needed.       Relevant Medications   sildenafil (REVATIO) 20 MG tablet   lisinopril (ZESTRIL) 10 MG tablet   Other Relevant Orders   Comprehensive metabolic panel   CBC with Differential/Platelet   Lipid Panel w/o Chol/HDL Ratio   RLS (restless legs  syndrome)    Checking labs today. Await results. If normal, consider requip.  Relevant Orders   Comprehensive metabolic panel   CBC with Differential/Platelet   Ferritin   Other Visit Diagnoses     Routine general medical examination at a health care facility    -  Primary   Vaccines up to date. Screening labs checked today.    Screening for prostate cancer       Labs drawn today. Await results.    Relevant Orders   PSA   Screening for colon cancer       Referral to GI made today.   Relevant Orders   Ambulatory referral to Gastroenterology        Discussed aspirin prophylaxis for myocardial infarction prevention and decision was made to continue ASA  LABORATORY TESTING:  Health maintenance labs ordered today as discussed above.   The natural history of prostate cancer and ongoing controversy regarding screening and potential treatment outcomes of prostate cancer has been discussed with the patient. The meaning of a false positive PSA and a false negative PSA has been discussed. He indicates understanding of the limitations of this screening test and wishes to proceed with screening PSA testing.   IMMUNIZATIONS:   - Tdap: Tetanus vaccination status reviewed: last tetanus booster within 10 years. - Influenza: Up to date - Pneumovax: Refused - Prevnar: Refused - COVID: Up to date - HPV: Not applicable - Shingrix vaccine: Refused  SCREENING: - Colonoscopy: Ordered today  Discussed with patient purpose of the colonoscopy is to detect colon cancer at curable precancerous or early stages   PATIENT COUNSELING:    Sexuality: Discussed sexually transmitted diseases, partner selection, use of condoms, avoidance of unintended pregnancy  and contraceptive alternatives.   Advised to avoid cigarette smoking.  I discussed with the patient that most people either abstain from alcohol or drink within safe limits (<=14/week and <=4 drinks/occasion for males, <=7/weeks and <= 3  drinks/occasion for females) and that the risk for alcohol disorders and other health effects rises proportionally with the number of drinks per week and how often a drinker exceeds daily limits.  Discussed cessation/primary prevention of drug use and availability of treatment for abuse.   Diet: Encouraged to adjust caloric intake to maintain  or achieve ideal body weight, to reduce intake of dietary saturated fat and total fat, to limit sodium intake by avoiding high sodium foods and not adding table salt, and to maintain adequate dietary potassium and calcium preferably from fresh fruits, vegetables, and low-fat dairy products.    stressed the importance of regular exercise  Injury prevention: Discussed safety belts, safety helmets, smoke detector, smoking near bedding or upholstery.   Dental health: Discussed importance of regular tooth brushing, flossing, and dental visits.   Follow up plan: NEXT PREVENTATIVE PHYSICAL DUE IN 1 YEAR. Return in about 6 months (around 06/19/2022).

## 2021-12-20 NOTE — Assessment & Plan Note (Signed)
Rechecking labs today. Await results. Treat as needed.  °

## 2021-12-21 LAB — CBC WITH DIFFERENTIAL/PLATELET
Basophils Absolute: 0 10*3/uL (ref 0.0–0.2)
Basos: 0 %
EOS (ABSOLUTE): 0.1 10*3/uL (ref 0.0–0.4)
Eos: 2 %
Hematocrit: 43.9 % (ref 37.5–51.0)
Hemoglobin: 15.6 g/dL (ref 13.0–17.7)
Immature Grans (Abs): 0 10*3/uL (ref 0.0–0.1)
Immature Granulocytes: 0 %
Lymphocytes Absolute: 2 10*3/uL (ref 0.7–3.1)
Lymphs: 27 %
MCH: 33.7 pg — ABNORMAL HIGH (ref 26.6–33.0)
MCHC: 35.5 g/dL (ref 31.5–35.7)
MCV: 95 fL (ref 79–97)
Monocytes Absolute: 0.5 10*3/uL (ref 0.1–0.9)
Monocytes: 7 %
Neutrophils Absolute: 4.6 10*3/uL (ref 1.4–7.0)
Neutrophils: 64 %
Platelets: 217 10*3/uL (ref 150–450)
RBC: 4.63 x10E6/uL (ref 4.14–5.80)
RDW: 12.4 % (ref 11.6–15.4)
WBC: 7.2 10*3/uL (ref 3.4–10.8)

## 2021-12-21 LAB — COMPREHENSIVE METABOLIC PANEL
ALT: 22 IU/L (ref 0–44)
AST: 23 IU/L (ref 0–40)
Albumin/Globulin Ratio: 2.5 — ABNORMAL HIGH (ref 1.2–2.2)
Albumin: 4.8 g/dL (ref 3.8–4.8)
Alkaline Phosphatase: 67 IU/L (ref 44–121)
BUN/Creatinine Ratio: 17 (ref 10–24)
BUN: 17 mg/dL (ref 8–27)
Bilirubin Total: 0.2 mg/dL (ref 0.0–1.2)
CO2: 23 mmol/L (ref 20–29)
Calcium: 9.5 mg/dL (ref 8.6–10.2)
Chloride: 103 mmol/L (ref 96–106)
Creatinine, Ser: 0.98 mg/dL (ref 0.76–1.27)
Globulin, Total: 1.9 g/dL (ref 1.5–4.5)
Glucose: 75 mg/dL (ref 70–99)
Potassium: 4.4 mmol/L (ref 3.5–5.2)
Sodium: 140 mmol/L (ref 134–144)
Total Protein: 6.7 g/dL (ref 6.0–8.5)
eGFR: 85 mL/min/{1.73_m2} (ref >59)

## 2021-12-21 LAB — FERRITIN: Ferritin: 321 ng/mL (ref 30–400)

## 2021-12-21 LAB — LIPID PANEL W/O CHOL/HDL RATIO
Cholesterol, Total: 196 mg/dL (ref 100–199)
HDL: 44 mg/dL (ref >39)
LDL Chol Calc (NIH): 110 mg/dL — ABNORMAL HIGH (ref 0–99)
Triglycerides: 240 mg/dL — ABNORMAL HIGH (ref 0–149)
VLDL Cholesterol Cal: 42 mg/dL — ABNORMAL HIGH (ref 5–40)

## 2021-12-21 LAB — PSA: Prostate Specific Ag, Serum: 1.1 ng/mL (ref 0.0–4.0)

## 2021-12-21 LAB — TSH: TSH: 1.07 u[IU]/mL (ref 0.450–4.500)

## 2021-12-24 ENCOUNTER — Other Ambulatory Visit: Payer: Self-pay

## 2021-12-24 DIAGNOSIS — Z86010 Personal history of colonic polyps: Secondary | ICD-10-CM

## 2021-12-24 MED ORDER — PEG 3350-KCL-NA BICARB-NACL 420 G PO SOLR: 4000.0000 mL | Freq: Once | ORAL | 0 refills | Status: AC

## 2021-12-24 NOTE — Progress Notes (Signed)
Gastroenterology Pre-Procedure Review  Request Date: 02/21/2022 Requesting Physician: Dr. Vicente Males  PATIENT REVIEW QUESTIONS: The patient responded to the following health history questions as indicated:  3 yr recall  1. Are you having any GI issues? no 2. Do you have a personal history of Polyps? yes (09/2018-polyps removed) 3. Do you have a family history of Colon Cancer or Polyps? no 4. Diabetes Mellitus? no 5. Joint replacements in the past 12 months?no 6. Major health problems in the past 3 months?no 7. Any artificial heart valves, MVP, or defibrillator?no    MEDICATIONS & ALLERGIES:    Patient reports the following regarding taking any anticoagulation/antiplatelet therapy:   Plavix, Coumadin, Eliquis, Xarelto, Lovenox, Pradaxa, Brilinta, or Effient? no Aspirin? no  Patient confirms/reports the following medications:  Current Outpatient Medications  Medication Sig Dispense Refill   acetaminophen (TYLENOL) 500 MG tablet Take 500 mg by mouth 2 (two) times daily.     lisinopril (ZESTRIL) 10 MG tablet Take 1 tablet (10 mg total) by mouth daily. 90 tablet 1   Multiple Vitamin (MULTIVITAMIN) tablet Take 1 tablet by mouth daily.     sildenafil (REVATIO) 20 MG tablet One or two pills by mouth daily prior to intimacy; 30 tablet 12   No current facility-administered medications for this visit.    Patient confirms/reports the following allergies:  No Known Allergies  No orders of the defined types were placed in this encounter.   AUTHORIZATION INFORMATION Primary Insurance: 1D#: Group #:  Secondary Insurance: 1D#: Group #:  SCHEDULE INFORMATION: Date: 02/21/2022 Time: Location: [place]

## 2022-02-19 ENCOUNTER — Telehealth: Payer: Self-pay | Admitting: Family Medicine

## 2022-02-19 ENCOUNTER — Telehealth: Payer: Self-pay

## 2022-02-19 ENCOUNTER — Telehealth: Payer: Self-pay | Admitting: Gastroenterology

## 2022-02-19 NOTE — Telephone Encounter (Signed)
Called the office of Dr. Jonathon Bellows on behalf of patient. Explained that patient was told that he had to call PCP office to reschedule colonoscopy. His call back number was then sent to their scheduling dept. To reschedule.  ?

## 2022-02-19 NOTE — Telephone Encounter (Signed)
ERROR

## 2022-02-19 NOTE — Telephone Encounter (Signed)
CALLED PATIENT NO ANSWER LEFT VOICEMAIL FOR A CALL BACK ? ?

## 2022-02-19 NOTE — Telephone Encounter (Signed)
WILL RESCHEDULE WHEN PATIENT CALLS BACK  CALLED ENDO AND CANCELED  ?

## 2022-02-19 NOTE — Telephone Encounter (Signed)
Copied from [place] [phone]. Topic: General - Other ?>> Feb 19, 2022 12:29 PM Leward Quan A wrote: ?Reason for CRM: Patient called in to inform Dr Wynetta Emery that he will not be able to go in for his colonoscopy on Friday 02/21/22. Patient was told to call and inform PCP so that she could make changes to the scheduled visit. Please call patient at Ph#  [phone] ?

## 2022-02-19 NOTE — Telephone Encounter (Signed)
Pt left message to cancel procedure on Friday and would like a call back to get reschedule ? ? ?

## 2022-02-21 ENCOUNTER — Encounter: Admission: RE | Payer: Self-pay | Source: Home / Self Care

## 2022-02-21 ENCOUNTER — Ambulatory Visit
Admission: RE | Admit: 2022-02-21 | Payer: BLUE CROSS/BLUE SHIELD | Source: Home / Self Care | Admitting: Gastroenterology

## 2022-02-21 SURGERY — COLONOSCOPY WITH PROPOFOL
Anesthesia: General

## 2022-05-25 NOTE — Patient Instructions (Signed)

## 2022-05-30 ENCOUNTER — Ambulatory Visit (INDEPENDENT_AMBULATORY_CARE_PROVIDER_SITE_OTHER): Payer: BC Managed Care – PPO | Admitting: Nurse Practitioner

## 2022-05-30 ENCOUNTER — Encounter: Payer: Self-pay | Admitting: Nurse Practitioner

## 2022-05-30 VITALS — BP 121/68 | HR 57 | Temp 98.1°F | Ht 67.0 in | Wt 161.0 lb

## 2022-05-30 DIAGNOSIS — I129 Hypertensive chronic kidney disease with stage 1 through stage 4 chronic kidney disease, or unspecified chronic kidney disease: Secondary | ICD-10-CM

## 2022-05-30 DIAGNOSIS — G2581 Restless legs syndrome: Secondary | ICD-10-CM

## 2022-05-30 MED ORDER — LISINOPRIL 10 MG PO TABS: 10.0000 mg | ORAL_TABLET | Freq: Every day | ORAL | 1 refills | Status: DC

## 2022-05-30 MED ORDER — ROPINIROLE HCL 1 MG PO TABS: 1.0000 mg | ORAL_TABLET | Freq: Every day | ORAL | 1 refills | Status: DC

## 2022-05-30 NOTE — Progress Notes (Signed)
 BP 121/68   Pulse (!) 57   Temp 98.1 F (36.7 C) (Oral)   Ht 5' 7" (1.702 m)   Wt 161 lb (73 kg)   SpO2 97%   BMI 25.22 kg/m    Subjective:    Patient ID: Philip Atkins, male    DOB: 12/19/1955, 66 y.o.   MRN: [id number]  HPI: Philip Atkins is a 66 y.o. male  Chief Complaint  Patient presents with   Medication Management    Patient is here for a Medication Management follow up. Patient denies having any concerns at today's visit. Patient says he use to take medication for Restless Syndrome in the past and stopped taking the prescription and is now having symptoms again and would like to discuss with provider.     HYPERTENSION Continues on Lisinopril for blood pressure. Hypertension status: stable  Satisfied with current treatment? yes Duration of hypertension: chronic BP monitoring frequency:  rarely BP range: 118-120/70 BP medication side effects:  no Medication compliance: good compliance Aspirin: no Recurrent headaches: no Visual changes: no Palpitations: no Dyspnea: no Chest pain: no Lower extremity edema: no Dizzy/lightheaded: no   RESTLESS LEGS Did take Requip in past, but then did not need it for some time.  Then over past 3 months symptoms have returned.   Duration: chronic Discomfort description:  crawling Pain: no Location: calves Bilateral: yes Symmetric: yes Severity: moderate Onset:  gradual Frequency:  constant Symptoms only occur while legs at rest: yes Sudden unintentional leg jerking: no Bed partner bothered by leg movements: no LE numbness: no Decreased sensation: no Weakness: no Insomnia: no Daytime somnolence: no Fatigue: no Alleviating factors: Requip Aggravating factors: bedtime Status: worse Treatments attempted:    Relevant past medical, surgical, family and social history reviewed and updated as indicated. Interim medical history since our last visit reviewed. Allergies and medications reviewed and updated.  Review of  Systems  Constitutional:  Negative for activity change, diaphoresis, fatigue and fever.  Respiratory:  Negative for cough, chest tightness, shortness of breath and wheezing.   Cardiovascular:  Negative for chest pain, palpitations and leg swelling.  Gastrointestinal: Negative.   Neurological: Negative.   Psychiatric/Behavioral: Negative.      Per HPI unless specifically indicated above     Objective:    BP 121/68   Pulse (!) 57   Temp 98.1 F (36.7 C) (Oral)   Ht 5' 7" (1.702 m)   Wt 161 lb (73 kg)   SpO2 97%   BMI 25.22 kg/m   Wt Readings from Last 3 Encounters:  05/30/22 161 lb (73 kg)  12/20/21 156 lb 3.2 oz (70.9 kg)  06/10/21 158 lb 3.2 oz (71.8 kg)    Physical Exam Vitals and nursing note reviewed.  Constitutional:      General: He is awake. He is not in acute distress.    Appearance: He is well-developed and well-groomed. He is not ill-appearing or toxic-appearing.  HENT:     Head: Normocephalic and atraumatic.     Right Ear: Hearing normal. No drainage.     Left Ear: Hearing normal. No drainage.  Eyes:     General: Lids are normal.        Right eye: No discharge.        Left eye: No discharge.     Conjunctiva/sclera: Conjunctivae normal.     Pupils: Pupils are equal, round, and reactive to light.  Neck:     Thyroid: No thyromegaly.     Vascular: No   carotid bruit.  Cardiovascular:     Rate and Rhythm: Normal rate and regular rhythm.     Heart sounds: Normal heart sounds, S1 normal and S2 normal. No murmur heard.    No gallop.  Pulmonary:     Effort: Pulmonary effort is normal. No accessory muscle usage or respiratory distress.     Breath sounds: Normal breath sounds.  Abdominal:     General: Bowel sounds are normal.     Palpations: Abdomen is soft. There is no hepatomegaly or splenomegaly.  Musculoskeletal:        General: Normal range of motion.     Cervical back: Normal range of motion and neck supple.     Right lower leg: No edema.     Left lower  leg: No edema.  Skin:    General: Skin is warm and dry.     Capillary Refill: Capillary refill takes less than 2 seconds.  Neurological:     Mental Status: He is alert and oriented to person, place, and time.     Deep Tendon Reflexes: Reflexes are normal and symmetric.  Psychiatric:        Attention and Perception: Attention normal.        Mood and Affect: Mood normal.        Speech: Speech normal.        Behavior: Behavior normal. Behavior is cooperative.        Thought Content: Thought content normal.     Results for orders placed or performed in visit on 12/20/21  Comprehensive metabolic panel  Result Value Ref Range   Glucose 75 70 - 99 mg/dL   BUN 17 8 - 27 mg/dL   Creatinine, Ser 0.98 0.76 - 1.27 mg/dL   eGFR 85 >59 mL/min/1.73   BUN/Creatinine Ratio 17 10 - 24   Sodium 140 134 - 144 mmol/L   Potassium 4.4 3.5 - 5.2 mmol/L   Chloride 103 96 - 106 mmol/L   CO2 23 20 - 29 mmol/L   Calcium 9.5 8.6 - 10.2 mg/dL   Total Protein 6.7 6.0 - 8.5 g/dL   Albumin 4.8 3.8 - 4.8 g/dL   Globulin, Total 1.9 1.5 - 4.5 g/dL   Albumin/Globulin Ratio 2.5 (H) 1.2 - 2.2   Bilirubin Total 0.2 0.0 - 1.2 mg/dL   Alkaline Phosphatase 67 44 - 121 IU/L   AST 23 0 - 40 IU/L   ALT 22 0 - 44 IU/L  CBC with Differential/Platelet  Result Value Ref Range   WBC 7.2 3.4 - 10.8 x10E3/uL   RBC 4.63 4.14 - 5.80 x10E6/uL   Hemoglobin 15.6 13.0 - 17.7 g/dL   Hematocrit 43.9 37.5 - 51.0 %   MCV 95 79 - 97 fL   MCH 33.7 (H) 26.6 - 33.0 pg   MCHC 35.5 31.5 - 35.7 g/dL   RDW 12.4 11.6 - 15.4 %   Platelets 217 150 - 450 x10E3/uL   Neutrophils 64 Not Estab. %   Lymphs 27 Not Estab. %   Monocytes 7 Not Estab. %   Eos 2 Not Estab. %   Basos 0 Not Estab. %   Neutrophils Absolute 4.6 1.4 - 7.0 x10E3/uL   Lymphocytes Absolute 2.0 0.7 - 3.1 x10E3/uL   Monocytes Absolute 0.5 0.1 - 0.9 x10E3/uL   EOS (ABSOLUTE) 0.1 0.0 - 0.4 x10E3/uL   Basophils Absolute 0.0 0.0 - 0.2 x10E3/uL   Immature Granulocytes 0 Not  Estab. %   Immature Grans (Abs) 0.0 0.0 -   0.1 x10E3/uL  Lipid Panel w/o Chol/HDL Ratio  Result Value Ref Range   Cholesterol, Total 196 100 - 199 mg/dL   Triglycerides 240 (H) 0 - 149 mg/dL   HDL 44 >39 mg/dL   VLDL Cholesterol Cal 42 (H) 5 - 40 mg/dL   LDL Chol Calc (NIH) 110 (H) 0 - 99 mg/dL  PSA  Result Value Ref Range   Prostate Specific Ag, Serum 1.1 0.0 - 4.0 ng/mL  TSH  Result Value Ref Range   TSH 1.070 0.450 - 4.500 uIU/mL  Urinalysis, Routine w reflex microscopic  Result Value Ref Range   Specific Gravity, UA >1.030 (H) 1.005 - 1.030   pH, UA 6.0 5.0 - 7.5   Color, UA Yellow Yellow   Appearance Ur Clear Clear   Leukocytes,UA Negative Negative   Protein,UA Negative Negative/Trace   Glucose, UA Negative Negative   Ketones, UA Trace (A) Negative   RBC, UA Negative Negative   Bilirubin, UA Negative Negative   Urobilinogen, Ur 1.0 0.2 - 1.0 mg/dL   Nitrite, UA Negative Negative  Microalbumin, Urine Waived  Result Value Ref Range   Microalb, Ur Waived 30 (H) 0 - 19 mg/L   Creatinine, Urine Waived 300 10 - 300 mg/dL   Microalb/Creat Ratio <30 <30 mg/g  Ferritin  Result Value Ref Range   Ferritin 321 30 - 400 ng/mL      Assessment & Plan:   Problem List Items Addressed This Visit       Genitourinary   Benign hypertensive renal disease    Chronic, stable with BP at goal today.  Recommend he monitor BP at least a few mornings a week at home and document.  DASH diet at home.  Continue current medication regimen and adjust as needed.  Labs today: BMP and TSH.  Return in 6 months.  Refills sent in.         Other   RLS (restless legs syndrome) - Primary    History of, with return of symptoms 3 months ago.  Will restart Requip 1 MG QHS which offered him benefit in past and check labs today to include BMP, TSH, Mag.  Recommend taking Magnesium supplement 400 MG at night as well to help with symptoms and sleep.      Relevant Orders   Magnesium   Basic Metabolic  Panel (BMET)   TSH     Follow up plan: Return in about 7 months (around 12/23/2022) for Annual physical.      

## 2022-05-30 NOTE — Assessment & Plan Note (Signed)
Chronic, stable with BP at goal today.  Recommend he monitor BP at least a few mornings a week at home and document.  DASH diet at home.  Continue current medication regimen and adjust as needed.  Labs today: BMP and TSH.  Return in 6 months.  Refills sent in.

## 2022-05-30 NOTE — Assessment & Plan Note (Signed)
History of, with return of symptoms 3 months ago.  Will restart Requip 1 MG QHS which offered him benefit in past and check labs today to include BMP, TSH, Mag.  Recommend taking Magnesium supplement 400 MG at night as well to help with symptoms and sleep.

## 2022-05-31 LAB — BASIC METABOLIC PANEL
BUN/Creatinine Ratio: 19 (ref 10–24)
BUN: 17 mg/dL (ref 8–27)
CO2: 24 mmol/L (ref 20–29)
Calcium: 9.7 mg/dL (ref 8.6–10.2)
Chloride: 103 mmol/L (ref 96–106)
Creatinine, Ser: 0.88 mg/dL (ref 0.76–1.27)
Glucose: 99 mg/dL (ref 70–99)
Potassium: 4.1 mmol/L (ref 3.5–5.2)
Sodium: 139 mmol/L (ref 134–144)
eGFR: 95 mL/min/{1.73_m2} (ref >59)

## 2022-05-31 LAB — MAGNESIUM: Magnesium: 2.3 mg/dL (ref 1.6–2.3)

## 2022-05-31 LAB — TSH: TSH: 1.06 u[IU]/mL (ref 0.450–4.500)

## 2022-05-31 NOTE — Progress Notes (Signed)
Contacted via [place] morning Ilya, your labs have returned and everything remains nice and normal.  I do recommend trying the Magnesium tablet 400 MG at night still for restless leg symptoms and sleep.  Any questions? Keep being stellar!!  Thank you for allowing me to participate in your care.  I appreciate you. Kindest regards, Cevin Rubinstein

## 2022-06-19 ENCOUNTER — Ambulatory Visit: Payer: BC Managed Care – PPO | Admitting: Family Medicine

## 2022-06-26 ENCOUNTER — Ambulatory Visit: Payer: BC Managed Care – PPO | Admitting: Family Medicine

## 2022-11-04 ENCOUNTER — Encounter: Payer: Self-pay | Admitting: Family Medicine

## 2022-11-04 ENCOUNTER — Ambulatory Visit (INDEPENDENT_AMBULATORY_CARE_PROVIDER_SITE_OTHER): Payer: Medicare Other | Admitting: Family Medicine

## 2022-11-04 VITALS — BP 130/77 | HR 58 | Temp 97.8°F | Ht 67.0 in | Wt 162.1 lb

## 2022-11-04 DIAGNOSIS — R3911 Hesitancy of micturition: Secondary | ICD-10-CM

## 2022-11-04 DIAGNOSIS — G2581 Restless legs syndrome: Secondary | ICD-10-CM

## 2022-11-04 DIAGNOSIS — Z0000 Encounter for general adult medical examination without abnormal findings: Secondary | ICD-10-CM | POA: Diagnosis not present

## 2022-11-04 DIAGNOSIS — I129 Hypertensive chronic kidney disease with stage 1 through stage 4 chronic kidney disease, or unspecified chronic kidney disease: Secondary | ICD-10-CM | POA: Diagnosis not present

## 2022-11-04 DIAGNOSIS — N189 Chronic kidney disease, unspecified: Secondary | ICD-10-CM

## 2022-11-04 DIAGNOSIS — Z1211 Encounter for screening for malignant neoplasm of colon: Secondary | ICD-10-CM | POA: Diagnosis not present

## 2022-11-04 DIAGNOSIS — E782 Mixed hyperlipidemia: Secondary | ICD-10-CM

## 2022-11-04 DIAGNOSIS — M25511 Pain in right shoulder: Secondary | ICD-10-CM | POA: Diagnosis not present

## 2022-11-04 DIAGNOSIS — Z7189 Other specified counseling: Secondary | ICD-10-CM

## 2022-11-04 LAB — MICROALBUMIN, URINE WAIVED
Creatinine, Urine Waived: 10 mg/dL (ref 10–300)
Microalb, Ur Waived: 10 mg/L (ref 0–19)

## 2022-11-04 LAB — URINALYSIS, ROUTINE W REFLEX MICROSCOPIC
Bilirubin, UA: NEGATIVE
Glucose, UA: NEGATIVE
Ketones, UA: NEGATIVE
Leukocytes,UA: NEGATIVE
Nitrite, UA: NEGATIVE
Protein,UA: NEGATIVE
RBC, UA: NEGATIVE
Specific Gravity, UA: 1.01 (ref 1.005–1.030)
Urobilinogen, Ur: 0.2 mg/dL (ref 0.2–1.0)
pH, UA: 5.5 (ref 5.0–7.5)

## 2022-11-04 MED ORDER — ROPINIROLE HCL 1 MG PO TABS: 1.0000 mg | ORAL_TABLET | Freq: Every day | ORAL | 1 refills | Status: DC

## 2022-11-04 MED ORDER — LISINOPRIL 10 MG PO TABS: 10.0000 mg | ORAL_TABLET | Freq: Every day | ORAL | 1 refills | Status: DC

## 2022-11-04 MED ORDER — SILDENAFIL CITRATE 20 MG PO TABS: ORAL_TABLET | ORAL | 12 refills | Status: DC

## 2022-11-04 NOTE — Assessment & Plan Note (Signed)
Under good control on current regimen. Continue current regimen. Continue to monitor. Call with any concerns. Refills given. Labs drawn today.   

## 2022-11-04 NOTE — Assessment & Plan Note (Signed)
A voluntary discussion about advance care planning including the explanation and discussion of advance directives was extensively discussed  with the patient for 3 minutes with patient present.  Explanation about the health care proxy and Living will was reviewed and packet with forms with explanation of how to fill them out was given.  During this discussion, the patient was able to identify a health care proxy as his wife and plans to fill out the paperwork required.  Patient was offered a separate Advance Care Planning visit for further assistance with forms.    

## 2022-11-04 NOTE — Progress Notes (Signed)
BP 130/77   Pulse (!) 58   Temp 97.8 F (36.6 C) (Oral)   Ht '5\' 7"'$  (1.702 m)   Wt 162 lb 1.6 oz (73.5 kg)   SpO2 97%   BMI 25.39 kg/m    Subjective:    Patient ID: Philip Atkins, male    DOB: May 08, 1956, 66 y.o.   MRN: [id number]  HPI: Philip Atkins is a 66 y.o. male presenting on 11/04/2022 for comprehensive medical examination. Current medical complaints include:  HYPERTENSION / HYPERLIPIDEMIA Satisfied with current treatment? yes Duration of hypertension: chronic BP monitoring frequency: not checking BP medication side effects: no Past BP meds: lisinopril Duration of hyperlipidemia: chronic Cholesterol medication side effects: N/A Cholesterol supplements: none Past cholesterol medications: none Medication compliance: excellent compliance Aspirin: no Recent stressors: no Recurrent headaches: no Visual changes: no Palpitations: no Dyspnea: no Chest pain: no Lower extremity edema: no Dizzy/lightheaded: no  SHOULDER PAIN Duration: chronic Involved shoulder: right Mechanism of injury: unknown Location: diffuse Onset:gradual Severity: moderate  Quality:  aching and sore Frequency: intermittent Radiation: no Aggravating factors: lifting, movement, sleep, and throwing  Alleviating factors: nothing  Status: worse Treatments attempted: rest, ice, heat, sling, APAP, ibuprofen, and aleve  Relief with NSAIDs?:  mild Weakness: no Numbness: no Decreased grip strength: no Redness: no Swelling: no Bruising: no Fevers: no   He currently lives with: wife Interim Problems from his last visit: no  Functional Status Survey: Is the patient deaf or have difficulty hearing?: No Does the patient have difficulty seeing, even when wearing glasses/contacts?: No Does the patient have difficulty concentrating, remembering, or making decisions?: No Does the patient have difficulty walking or climbing stairs?: No Does the patient have difficulty dressing or bathing?:  No Does the patient have difficulty doing errands alone such as visiting a doctor's office or shopping?: No  FALL RISK:    11/04/2022    3:17 PM 05/30/2022    3:35 PM 12/20/2021    2:21 PM 06/10/2021    1:55 PM 01/09/2021    9:08 AM  [place] in the past year? 0 0 0 1 0  Number falls in past yr: 0 0 0 0 0  Injury with Fall? 0 0 0 0 0  Risk for fall due to : No Fall Risks No Fall Risks No Fall Risks No Fall Risks No Fall Risks  Follow up Falls evaluation completed Falls evaluation completed Falls evaluation completed Falls evaluation completed Falls evaluation completed    Depression Screen    11/04/2022    3:17 PM 05/30/2022    3:35 PM 12/20/2021    2:21 PM 06/10/2021    1:55 PM 05/14/2020    3:23 PM  Depression screen PHQ 2/9  Decreased Interest 0 0 0 0 0  Down, Depressed, Hopeless 0 1 1 0 0  PHQ - 2 Score 0 1 1 0 0  Altered sleeping 0 '1 1  1  '$ Tired, decreased energy 0 0 1  1  Change in appetite 0 0 0  0  Feeling bad or failure about yourself  0 0 0  0  Trouble concentrating 0 0 0  0  Moving slowly or fidgety/restless 0 0 0  0  Suicidal thoughts 0 0 0  0  PHQ-9 Score 0 '2 3  2  '$ Difficult doing work/chores Not difficult at all Not difficult at all   Not difficult at all   Advanced Directives Does not have one  Past Medical History:  Past  Medical History:  Diagnosis Date   Abscess of right buttock 11/03/2015   Abscess of right hip 11/03/2015   ED (erectile dysfunction)    Gastric ulceration    Hypertension    Migraines    MRSA (methicillin resistant Staphylococcus aureus) June 2016   Neoplasm of uncertain behavior    Neoplasm of uncertain behavior    Squamous cell carcinoma 06/2013    Surgical History:  Past Surgical History:  Procedure Laterality Date   COLONOSCOPY  08/08/11   1 benign polyp; one tubular adenoma; repeat 5 years   COLONOSCOPY WITH PROPOFOL N/A 10/05/2018   Procedure: COLONOSCOPY WITH PROPOFOL;  Surgeon: Jonathon Bellows, MD;  Location: [facility]  ENDOSCOPY;  Service: Gastroenterology;  Laterality: N/A;   FINGER TENDON REPAIR      Medications:  Current Outpatient Medications on File Prior to Visit  Medication Sig   acetaminophen (TYLENOL) 500 MG tablet Take 500 mg by mouth 2 (two) times daily.   Multiple Vitamin (MULTIVITAMIN) tablet Take 1 tablet by mouth daily.   No current facility-administered medications on file prior to visit.    Allergies:  No Known Allergies  Social History:  Social History   Socioeconomic History   Marital status: Married    Spouse name: Not on file   Number of children: Not on file   Years of education: Not on file   Highest education level: Not on file  Occupational History   Not on file  Tobacco Use   Smoking status: Every Day    Packs/day: 0.25    Types: Cigarettes   Smokeless tobacco: Never  Vaping Use   Vaping Use: Some days  Substance and Sexual Activity   Alcohol use: No   Drug use: No   Sexual activity: Yes  Other Topics Concern   Not on file  Social History Narrative   Not on file   Social Determinants of Health   Financial Resource Strain: Not on file  Food Insecurity: Not on file  Transportation Needs: Not on file  Physical Activity: Not on file  Stress: Not on file  Social Connections: Not on file  Intimate Partner Violence: Not on file   Social History   Tobacco Use  Smoking Status Every Day   Packs/day: 0.25   Types: Cigarettes  Smokeless Tobacco Never   Social History   Substance and Sexual Activity  Alcohol Use No    Family History:  Family History  Problem Relation Age of Onset   Heart attack Mother    Hypertension Mother    Heart disease Mother    Diabetes Brother    Asthma Brother    Heart disease Maternal Grandmother    Cancer Neg Hx    COPD Neg Hx    Stroke Neg Hx     Past medical history, surgical history, medications, allergies, family history and social history reviewed with patient today and changes made to appropriate areas of  the chart.   Review of Systems  Constitutional: Negative.   HENT: Negative.    Eyes: Negative.   Respiratory: Negative.    Cardiovascular: Negative.   Gastrointestinal: Negative.   Genitourinary: Negative.   Musculoskeletal:  Positive for joint pain. Negative for back pain, falls, myalgias and neck pain.  Skin: Negative.   Neurological: Negative.   Endo/Heme/Allergies: Negative.   Psychiatric/Behavioral: Negative.     All other ROS negative except what is listed above and in the HPI.      Objective:    BP 130/77  Pulse (!) 58   Temp 97.8 F (36.6 C) (Oral)   Ht '5\' 7"'$  (1.702 m)   Wt 162 lb 1.6 oz (73.5 kg)   SpO2 97%   BMI 25.39 kg/m   Wt Readings from Last 3 Encounters:  11/04/22 162 lb 1.6 oz (73.5 kg)  05/30/22 161 lb (73 kg)  12/20/21 156 lb 3.2 oz (70.9 kg)     Physical Exam Vitals and nursing note reviewed.  Constitutional:      General: He is not in acute distress.    Appearance: Normal appearance. He is normal weight. He is not ill-appearing, toxic-appearing or diaphoretic.  HENT:     Head: Normocephalic and atraumatic.     Right Ear: Tympanic membrane, ear canal and external ear normal. There is no impacted cerumen.     Left Ear: Tympanic membrane, ear canal and external ear normal. There is no impacted cerumen.     Nose: Nose normal. No congestion or rhinorrhea.     Mouth/Throat:     Mouth: Mucous membranes are moist.     Pharynx: Oropharynx is clear. No oropharyngeal exudate or posterior oropharyngeal erythema.  Eyes:     General: No scleral icterus.       Right eye: No discharge.        Left eye: No discharge.     Extraocular Movements: Extraocular movements intact.     Conjunctiva/sclera: Conjunctivae normal.     Pupils: Pupils are equal, round, and reactive to light.  Neck:     Vascular: No carotid bruit.  Cardiovascular:     Rate and Rhythm: Normal rate and regular rhythm.     Pulses: Normal pulses.     Heart sounds: No murmur heard.    No  friction rub. No gallop.  Pulmonary:     Effort: Pulmonary effort is normal. No respiratory distress.     Breath sounds: Normal breath sounds. No stridor. No wheezing, rhonchi or rales.  Chest:     Chest wall: No tenderness.  Abdominal:     General: Abdomen is flat. Bowel sounds are normal. There is no distension.     Palpations: Abdomen is soft. There is no mass.     Tenderness: There is no abdominal tenderness. There is no right CVA tenderness, left CVA tenderness, guarding or rebound.     Hernia: No hernia is present.  Genitourinary:    Comments: Genital exam deferred with shared decision making Musculoskeletal:        General: No swelling, tenderness, deformity or signs of injury.     Cervical back: Normal range of motion and neck supple. No rigidity. No muscular tenderness.     Right lower leg: No edema.     Left lower leg: No edema.  Lymphadenopathy:     Cervical: No cervical adenopathy.  Skin:    General: Skin is warm and dry.     Capillary Refill: Capillary refill takes less than 2 seconds.     Coloration: Skin is not jaundiced or pale.     Findings: No bruising, erythema, lesion or rash.  Neurological:     General: No focal deficit present.     Mental Status: He is alert and oriented to person, place, and time.     Cranial Nerves: No cranial nerve deficit.     Sensory: No sensory deficit.     Motor: No weakness.     Coordination: Coordination normal.     Gait: Gait normal.     Deep Tendon  Reflexes: Reflexes normal.  Psychiatric:        Mood and Affect: Mood normal.        Behavior: Behavior normal.        Thought Content: Thought content normal.        Judgment: Judgment normal.        11/04/2022    3:44 PM  6CIT Screen  What Year? 0 points  What month? 0 points  What time? 0 points  Count back from 20 0 points  Months in reverse 0 points  Repeat phrase 6 points  Total Score 6 points     Results for orders placed or performed in visit on 11/04/22   Microalbumin, Urine Waived  Result Value Ref Range   Microalb, Ur Waived 10 0 - 19 mg/L   Creatinine, Urine Waived 10 10 - 300 mg/dL   Microalb/Creat Ratio 30-300 (H) <30 mg/g  Urinalysis, Routine w reflex microscopic  Result Value Ref Range   Specific Gravity, UA 1.010 1.005 - 1.030   pH, UA 5.5 5.0 - 7.5   Color, UA Yellow Yellow   Appearance Ur Clear Clear   Leukocytes,UA Negative Negative   Protein,UA Negative Negative/Trace   Glucose, UA Negative Negative   Ketones, UA Negative Negative   RBC, UA Negative Negative   Bilirubin, UA Negative Negative   Urobilinogen, Ur 0.2 0.2 - 1.0 mg/dL   Nitrite, UA Negative Negative   Microscopic Examination Comment       Assessment & Plan:   Problem List Items Addressed This Visit       Genitourinary   Benign hypertensive renal disease    Under good control on current regimen. Continue current regimen. Continue to monitor. Call with any concerns. Refills given. Labs drawn today.       Relevant Orders   CBC with Differential/Platelet   Microalbumin, Urine Waived (Completed)   Urinalysis, Routine w reflex microscopic (Completed)   TSH   Comprehensive metabolic panel     Other   Mixed hyperlipidemia    Rechecking labs today. Await results.       Relevant Medications   lisinopril (ZESTRIL) 10 MG tablet   sildenafil (REVATIO) 20 MG tablet   Other Relevant Orders   CBC with Differential/Platelet   Comprehensive metabolic panel   Lipid Panel w/o Chol/HDL Ratio   RLS (restless legs syndrome)    Under good control on current regimen. Continue current regimen. Continue to monitor. Call with any concerns. Refills given.        Advance directive discussed with patient    A voluntary discussion about advance care planning including the explanation and discussion of advance directives was extensively discussed  with the patient for 3 minutes with patient present.  Explanation about the health care proxy and Living will was  reviewed and packet with forms with explanation of how to fill them out was given.  During this discussion, the patient was able to identify a health care proxy as his wife and plans to fill out the paperwork required.  Patient was offered a separate Cloud visit for further assistance with forms.         Other Visit Diagnoses     Encounter for initial annual wellness visit (AWV) in Medicare patient    -  Primary   Preventative care discussed today as below.   Routine general medical examination at a health care facility       Vaccines up to date/declined. Screening labs checked today.  Colonoscopy ordered today. Will consider low dose CT. Continue diet and exercise. Call with concerns   Acute pain of right shoulder       Relevant Orders   DG Shoulder Right   Hesitancy       Labs drawn today. Await results.   Relevant Orders   PSA   Screening for colon cancer       Referral to GI placed today.   Relevant Orders   Ambulatory referral to Gastroenterology        Preventative Services:  Health Risk Assessment and Personalized Prevention Plan: Done today Bone Mass Measurements: N/A CVD Screening: Done today Colon Cancer Screening: Ordered today Depression Screening: Done today Diabetes Screening: Done today Glaucoma Screening: See your eye doctor Hepatitis B vaccine: N/A Hepatitis C screening: Up to date HIV Screening: up to date Flu Vaccine: up to date Lung cancer Screening: will think about Obesity Screening: done today Pneumonia Vaccines: declined STI Screening: N/A PSA screening: Done today  LABORATORY TESTING:  Health maintenance labs ordered today as discussed above.   The natural history of prostate cancer and ongoing controversy regarding screening and potential treatment outcomes of prostate cancer has been discussed with the patient. The meaning of a false positive PSA and a false negative PSA has been discussed. He indicates understanding of the  limitations of this screening test and wishes to proceed with screening PSA testing.   IMMUNIZATIONS:   - Tdap: Tetanus vaccination status reviewed: last tetanus booster within 10 years. - Influenza: Up to date - Prevnar: Refused - Zostavax vaccine: Refused  SCREENING: - Colonoscopy: Ordered today  Discussed with patient purpose of the colonoscopy is to detect colon cancer at curable precancerous or early stages   PATIENT COUNSELING:    Sexuality: Discussed sexually transmitted diseases, partner selection, use of condoms, avoidance of unintended pregnancy  and contraceptive alternatives.   Advised to avoid cigarette smoking.  I discussed with the patient that most people either abstain from alcohol or drink within safe limits (<=14/week and <=4 drinks/occasion for males, <=7/weeks and <= 3 drinks/occasion for females) and that the risk for alcohol disorders and other health effects rises proportionally with the number of drinks per week and how often a drinker exceeds daily limits.  Discussed cessation/primary prevention of drug use and availability of treatment for abuse.   Diet: Encouraged to adjust caloric intake to maintain  or achieve ideal body weight, to reduce intake of dietary saturated fat and total fat, to limit sodium intake by avoiding high sodium foods and not adding table salt, and to maintain adequate dietary potassium and calcium preferably from fresh fruits, vegetables, and low-fat dairy products.    stressed the importance of regular exercise  Injury prevention: Discussed safety belts, safety helmets, smoke detector, smoking near bedding or upholstery.   Dental health: Discussed importance of regular tooth brushing, flossing, and dental visits.   Follow up plan: NEXT PREVENTATIVE PHYSICAL DUE IN 1 YEAR. Return in about 6 months (around 05/06/2023).

## 2022-11-04 NOTE — Assessment & Plan Note (Signed)
Rechecking labs today. Await results.  

## 2022-11-04 NOTE — Patient Instructions (Signed)
Preventative Services:  Health Risk Assessment and Personalized Prevention Plan: Done today Bone Mass Measurements: N/A CVD Screening: Done today Colon Cancer Screening: Ordered today Depression Screening: Done today Diabetes Screening: Done today Glaucoma Screening: See your eye doctor Hepatitis B vaccine: N/A Hepatitis C screening: Up to date HIV Screening: up to date Flu Vaccine: up to date Lung cancer Screening: will think about Obesity Screening: done today Pneumonia Vaccines: declined STI Screening: N/A PSA screening: Done today

## 2022-11-04 NOTE — Assessment & Plan Note (Signed)
Under good control on current regimen. Continue current regimen. Continue to monitor. Call with any concerns. Refills given.   

## 2022-11-05 LAB — COMPREHENSIVE METABOLIC PANEL
ALT: 19 IU/L (ref 0–44)
AST: 22 IU/L (ref 0–40)
Albumin/Globulin Ratio: 2.6 — ABNORMAL HIGH (ref 1.2–2.2)
Albumin: 4.9 g/dL (ref 3.9–4.9)
Alkaline Phosphatase: 66 IU/L (ref 44–121)
BUN/Creatinine Ratio: 15 (ref 10–24)
BUN: 16 mg/dL (ref 8–27)
Bilirubin Total: 0.4 mg/dL (ref 0.0–1.2)
CO2: 23 mmol/L (ref 20–29)
Calcium: 10 mg/dL (ref 8.6–10.2)
Chloride: 100 mmol/L (ref 96–106)
Creatinine, Ser: 1.06 mg/dL (ref 0.76–1.27)
Globulin, Total: 1.9 g/dL (ref 1.5–4.5)
Glucose: 90 mg/dL (ref 70–99)
Potassium: 4.5 mmol/L (ref 3.5–5.2)
Sodium: 138 mmol/L (ref 134–144)
Total Protein: 6.8 g/dL (ref 6.0–8.5)
eGFR: 77 mL/min/{1.73_m2} (ref >59)

## 2022-11-05 LAB — LIPID PANEL W/O CHOL/HDL RATIO
Cholesterol, Total: 199 mg/dL (ref 100–199)
HDL: 47 mg/dL (ref >39)
LDL Chol Calc (NIH): 117 mg/dL — ABNORMAL HIGH (ref 0–99)
Triglycerides: 200 mg/dL — ABNORMAL HIGH (ref 0–149)
VLDL Cholesterol Cal: 35 mg/dL (ref 5–40)

## 2022-11-05 LAB — PSA: Prostate Specific Ag, Serum: 1.6 ng/mL (ref 0.0–4.0)

## 2022-11-05 LAB — CBC WITH DIFFERENTIAL/PLATELET
Basophils Absolute: 0 10*3/uL (ref 0.0–0.2)
Basos: 1 %
EOS (ABSOLUTE): 0.1 10*3/uL (ref 0.0–0.4)
Eos: 1 %
Hematocrit: 46.4 % (ref 37.5–51.0)
Hemoglobin: 15.6 g/dL (ref 13.0–17.7)
Immature Grans (Abs): 0 10*3/uL (ref 0.0–0.1)
Immature Granulocytes: 0 %
Lymphocytes Absolute: 1.9 10*3/uL (ref 0.7–3.1)
Lymphs: 27 %
MCH: 32.2 pg (ref 26.6–33.0)
MCHC: 33.6 g/dL (ref 31.5–35.7)
MCV: 96 fL (ref 79–97)
Monocytes Absolute: 0.5 10*3/uL (ref 0.1–0.9)
Monocytes: 7 %
Neutrophils Absolute: 4.6 10*3/uL (ref 1.4–7.0)
Neutrophils: 64 %
Platelets: 208 10*3/uL (ref 150–450)
RBC: 4.84 x10E6/uL (ref 4.14–5.80)
RDW: 12.1 % (ref 11.6–15.4)
WBC: 7.1 10*3/uL (ref 3.4–10.8)

## 2022-11-05 LAB — TSH: TSH: 1.32 u[IU]/mL (ref 0.450–4.500)

## 2022-12-31 ENCOUNTER — Ambulatory Visit: Payer: Medicare Other | Admitting: Family Medicine

## 2023-02-13 DIAGNOSIS — M25511 Pain in right shoulder: Secondary | ICD-10-CM | POA: Diagnosis not present

## 2023-03-25 DIAGNOSIS — M25511 Pain in right shoulder: Secondary | ICD-10-CM | POA: Diagnosis not present

## 2023-05-07 ENCOUNTER — Ambulatory Visit: Payer: Medicare Other | Admitting: Family Medicine

## 2023-07-01 ENCOUNTER — Telehealth: Payer: Self-pay | Admitting: Family Medicine

## 2023-07-01 NOTE — Telephone Encounter (Signed)
Medication Refill - Medication: lisinopril (ZESTRIL) 10 MG tablet   Has the patient contacted their pharmacy? No.   Preferred Pharmacy (with phone number or street name):  [facility] [place], [place] - [address] Phone: [phone]  Fax: [phone]    Has the patient been seen for an appointment in the last year OR does the patient have an upcoming appointment? No.  Please assist patient further

## 2023-07-02 MED ORDER — LISINOPRIL 10 MG PO TABS: 10.0000 mg | ORAL_TABLET | Freq: Every day | ORAL | 0 refills | Status: DC

## 2023-07-02 NOTE — Telephone Encounter (Signed)
OV needed for additional refills.  Requested Prescriptions  Pending Prescriptions Disp Refills   lisinopril (ZESTRIL) 10 MG tablet 90 tablet 0    Sig: Take 1 tablet (10 mg total) by mouth daily.     Cardiovascular:  ACE Inhibitors Failed - 07/01/2023 10:08 AM      Failed - Cr in normal range and within 180 days    Creatinine, Ser  Date Value Ref Range Status  11/04/2022 1.06 0.76 - 1.27 mg/dL Final         Failed - K in normal range and within 180 days    Potassium  Date Value Ref Range Status  11/04/2022 4.5 3.5 - 5.2 mmol/L Final         Failed - Valid encounter within last 6 months    Recent Outpatient Visits           8 months ago Encounter for initial annual wellness visit (AWV) in Medicare patient   [facility] [place], Megan P, DO   1 year ago RLS (restless legs syndrome)   [facility] [place], Corrie Dandy T, NP   1 year ago Routine general medical examination at a health care facility   [facility] [place], [place] P, DO   2 years ago Routine general medical examination at a health care facility   [facility] [place], [place] P, DO   2 years ago Benign hypertensive renal disease   [facility] [place], [place], DO              Passed - Patient is not pregnant      Passed - Last BP in normal range    BP Readings from Last 1 Encounters:  11/04/22 130/77

## 2023-10-13 ENCOUNTER — Other Ambulatory Visit: Payer: Self-pay | Admitting: Family Medicine

## 2023-10-13 ENCOUNTER — Telehealth: Payer: Self-pay | Admitting: Family Medicine

## 2023-10-13 MED ORDER — LISINOPRIL 10 MG PO TABS: 10.0000 mg | ORAL_TABLET | Freq: Every day | ORAL | 0 refills | Status: DC

## 2023-10-13 NOTE — Telephone Encounter (Signed)
Called patient to reschedule his physical because it was

## 2023-10-13 NOTE — Telephone Encounter (Signed)
Called patient to reschedule his physical because it was too early to get due to insurance purposes.  It is rescheduled on 11/06/2023 @ 3:20 pm.  Pt stated that he would run out of his blood pressure medication prior to this new appointment.  Please advise.

## 2023-10-13 NOTE — Telephone Encounter (Signed)
Created in error

## 2023-10-13 NOTE — Telephone Encounter (Signed)
Routing to provider. T'd up prescription for Lisinopril.

## 2023-10-23 ENCOUNTER — Encounter: Payer: Medicare Other | Admitting: Family Medicine

## 2023-11-06 ENCOUNTER — Encounter: Payer: Self-pay | Admitting: Family Medicine

## 2023-11-06 ENCOUNTER — Ambulatory Visit: Payer: Medicare Other | Admitting: Family Medicine

## 2023-11-06 VITALS — BP 136/74 | HR 69 | Temp 98.0°F | Ht 67.0 in | Wt 165.0 lb

## 2023-11-06 DIAGNOSIS — E782 Mixed hyperlipidemia: Secondary | ICD-10-CM

## 2023-11-06 DIAGNOSIS — Z0000 Encounter for general adult medical examination without abnormal findings: Secondary | ICD-10-CM

## 2023-11-06 DIAGNOSIS — D692 Other nonthrombocytopenic purpura: Secondary | ICD-10-CM

## 2023-11-06 DIAGNOSIS — R3911 Hesitancy of micturition: Secondary | ICD-10-CM | POA: Diagnosis not present

## 2023-11-06 DIAGNOSIS — I129 Hypertensive chronic kidney disease with stage 1 through stage 4 chronic kidney disease, or unspecified chronic kidney disease: Secondary | ICD-10-CM | POA: Diagnosis not present

## 2023-11-06 DIAGNOSIS — G2581 Restless legs syndrome: Secondary | ICD-10-CM | POA: Diagnosis not present

## 2023-11-06 LAB — MICROALBUMIN, URINE WAIVED
Creatinine, Urine Waived: 200 mg/dL (ref 10–300)
Microalb, Ur Waived: 30 mg/L — ABNORMAL HIGH (ref 0–19)
Microalb/Creat Ratio: <30 mg/g (ref <30)

## 2023-11-06 MED ORDER — ROPINIROLE HCL 1 MG PO TABS: 1.0000 mg | ORAL_TABLET | Freq: Every day | ORAL | 1 refills | Status: DC

## 2023-11-06 MED ORDER — SILDENAFIL CITRATE 20 MG PO TABS: ORAL_TABLET | ORAL | 12 refills | Status: DC

## 2023-11-06 MED ORDER — TADALAFIL 20 MG PO TABS: 10.0000 mg | ORAL_TABLET | ORAL | 11 refills | Status: DC | PRN

## 2023-11-06 MED ORDER — LISINOPRIL 10 MG PO TABS: 10.0000 mg | ORAL_TABLET | Freq: Every day | ORAL | 1 refills | Status: DC

## 2023-11-06 NOTE — Progress Notes (Signed)
BP 136/74   Pulse 69   Temp 98 F (36.7 C) (Oral)   Ht 5\' 7"  (1.702 m)   Wt 165 lb (74.8 kg)   SpO2 96%   BMI 25.84 kg/m    Subjective:    Patient ID: Philip Atkins, male    DOB: [birth year], 67 y.o.   MRN: [id number]  HPI: Philip Atkins is a 68 y.o. male presenting on 11/06/2023 for comprehensive medical examination. Current medical complaints include:  HYPERTENSION / HYPERLIPIDEMIA Satisfied with current treatment? yes Duration of hypertension: chronic BP monitoring frequency: not checking BP medication side effects: no Past BP meds: lisinopril Duration of hyperlipidemia: chronic Cholesterol medication side effects: no Cholesterol supplements: none Past cholesterol medications: none Medication compliance: excellent compliance Aspirin: no Recent stressors: yes Recurrent headaches: no Visual changes: no Palpitations: no Dyspnea: no Chest pain: no Lower extremity edema: no Dizzy/lightheaded: no  He currently lives with: wife and MIL Interim Problems from his last visit: no  Functional Status Survey: Is the patient deaf or have difficulty hearing?: No Does the patient have difficulty seeing, even when wearing glasses/contacts?: No Does the patient have difficulty concentrating, remembering, or making decisions?: No Does the patient have difficulty walking or climbing stairs?: No Does the patient have difficulty dressing or bathing?: No Does the patient have difficulty doing errands alone such as visiting a doctor's office or shopping?: No  FALL RISK:    11/04/2022    3:17 PM 05/30/2022    3:35 PM 12/20/2021    2:21 PM 06/10/2021    1:55 PM 01/09/2021    9:08 AM  Fall Risk   Falls in the past year? 0 0 0 1 0  Number falls in past yr: 0 0 0 0 0  Injury with Fall? 0 0 0 0 0  Risk for fall due to : No Fall Risks No Fall Risks No Fall Risks No Fall Risks No Fall Risks  Follow up Falls evaluation completed Falls evaluation completed Falls evaluation completed  Falls evaluation completed Falls evaluation completed    Depression Screen    11/06/2023    3:09 PM 11/04/2022    3:17 PM 05/30/2022    3:35 PM 12/20/2021    2:21 PM 06/10/2021    1:55 PM  Depression screen PHQ 2/9  Decreased Interest 1 0 0 0 0  Down, Depressed, Hopeless 0 0 1 1 0  PHQ - 2 Score 1 0 1 1 0  Altered sleeping 0 0 1 1   Tired, decreased energy 1 0 0 1   Change in appetite 0 0 0 0   Feeling bad or failure about yourself  0 0 0 0   Trouble concentrating 0 0 0 0   Moving slowly or fidgety/restless 0 0 0 0   Suicidal thoughts 0 0 0 0   PHQ-9 Score 2 0 2 3   Difficult doing work/chores  Not difficult at all Not difficult at all      Advanced Directives Does patient have a HCPOA?    yes If yes, name and contact information:  Does patient have a living will or MOST form?  yes  Past Medical History:  Past Medical History:  Diagnosis Date   Abscess of right buttock 11/03/2015   Abscess of right hip 11/03/2015   ED (erectile dysfunction)    Gastric ulceration    Hypertension    Migraines    MRSA (methicillin resistant Staphylococcus aureus) June 2016   Neoplasm of uncertain behavior  Neoplasm of uncertain behavior    Squamous cell carcinoma 06/2013    Surgical History:  Past Surgical History:  Procedure Laterality Date   COLONOSCOPY  08/08/11   1 benign polyp; one tubular adenoma; repeat 5 years   COLONOSCOPY WITH PROPOFOL N/A 10/05/2018   Procedure: COLONOSCOPY WITH PROPOFOL;  Surgeon: Wyline Mood, MD;  Location[facility] ENDOSCOPY;  Service: Gastroenterology;  Laterality: N/A;   FINGER TENDON REPAIR      Medications:  Current Outpatient Medications on File Prior to Visit  Medication Sig   acetaminophen (TYLENOL) 500 MG tablet Take 500 mg by mouth 2 (two) times daily.   Multiple Vitamin (MULTIVITAMIN) tablet Take 1 tablet by mouth daily.   Omeprazole Magnesium (PRILOSEC) 2.5 MG PACK Take by mouth. 14 day course , currently on day 12 as of 11/06/23   No  current facility-administered medications on file prior to visit.    Allergies:  No Known Allergies  Social History:  Social History   Socioeconomic History   Marital status: Married    Spouse name: Not on file   Number of children: Not on file   Years of education: Not on file   Highest education level: Not on file  Occupational History   Not on file  Tobacco Use   Smoking status: Every Day    Current packs/day: 0.25    Types: Cigarettes   Smokeless tobacco: Never  Vaping Use   Vaping status: Some Days  Substance and Sexual Activity   Alcohol use: No   Drug use: No   Sexual activity: Yes  Other Topics Concern   Not on file  Social History Narrative   Not on file   Social Drivers of Health   Financial Resource Strain: Not on file  Food Insecurity: Not on file  Transportation Needs: Not on file  Physical Activity: Not on file  Stress: Not on file  Social Connections: Not on file  Intimate Partner Violence: Not on file   Social History   Tobacco Use  Smoking Status Every Day   Current packs/day: 0.25   Types: Cigarettes  Smokeless Tobacco Never   Social History   Substance and Sexual Activity  Alcohol Use No    Family History:  Family History  Problem Relation Age of Onset   Heart attack Mother    Hypertension Mother    Heart disease Mother    Diabetes Brother    Asthma Brother    Heart disease Maternal Grandmother    Cancer Neg Hx    COPD Neg Hx    Stroke Neg Hx     Past medical history, surgical history, medications, allergies, family history and social history reviewed with patient today and changes made to appropriate areas of the chart.   Review of Systems  Constitutional: Negative.   HENT: Negative.    Eyes: Negative.   Respiratory:  Positive for cough. Negative for hemoptysis, sputum production, shortness of breath and wheezing.   Cardiovascular: Negative.   Gastrointestinal: Negative.   Genitourinary: Negative.   Musculoskeletal:  Negative.   Skin: Negative.   Neurological: Negative.   Endo/Heme/Allergies:  Positive for environmental allergies. Negative for polydipsia. Bruises/bleeds easily.  Psychiatric/Behavioral: Negative.     All other ROS negative except what is listed above and in the HPI.      Objective:    BP 136/74   Pulse 69   Temp 98 F (36.7 C) (Oral)   Ht 5\' 7"  (1.702 m)   Wt 165 lb (74.8  kg)   SpO2 96%   BMI 25.84 kg/m   Wt Readings from Last 3 Encounters:  11/06/23 165 lb (74.8 kg)  11/04/22 162 lb 1.6 oz (73.5 kg)  05/30/22 161 lb (73 kg)     Physical Exam Vitals and nursing note reviewed.  Constitutional:      General: He is not in acute distress.    Appearance: Normal appearance. He is not ill-appearing, toxic-appearing or diaphoretic.  HENT:     Head: Normocephalic and atraumatic.     Right Ear: Tympanic membrane, ear canal and external ear normal. There is no impacted cerumen.     Left Ear: Tympanic membrane, ear canal and external ear normal. There is no impacted cerumen.     Nose: Nose normal. No congestion or rhinorrhea.     Mouth/Throat:     Mouth: Mucous membranes are moist.     Pharynx: Oropharynx is clear. No oropharyngeal exudate or posterior oropharyngeal erythema.  Eyes:     General: No scleral icterus.       Right eye: No discharge.        Left eye: No discharge.     Extraocular Movements: Extraocular movements intact.     Conjunctiva/sclera: Conjunctivae normal.     Pupils: Pupils are equal, round, and reactive to light.  Neck:     Vascular: No carotid bruit.  Cardiovascular:     Rate and Rhythm: Normal rate and regular rhythm.     Pulses: Normal pulses.     Heart sounds: No murmur heard.    No friction rub. No gallop.  Pulmonary:     Effort: Pulmonary effort is normal. No respiratory distress.     Breath sounds: Normal breath sounds. No stridor. No wheezing, rhonchi or rales.  Chest:     Chest wall: No tenderness.  Abdominal:     General: Abdomen is  flat. Bowel sounds are normal. There is no distension.     Palpations: Abdomen is soft. There is no mass.     Tenderness: There is no abdominal tenderness. There is no right CVA tenderness, left CVA tenderness, guarding or rebound.     Hernia: No hernia is present.  Genitourinary:    Comments: Genital exam deferred with shared decision making Musculoskeletal:        General: No swelling, tenderness, deformity or signs of injury.     Cervical back: Normal range of motion and neck supple. No rigidity. No muscular tenderness.     Right lower leg: No edema.     Left lower leg: No edema.  Lymphadenopathy:     Cervical: No cervical adenopathy.  Skin:    General: Skin is warm and dry.     Capillary Refill: Capillary refill takes less than 2 seconds.     Coloration: Skin is not jaundiced or pale.     Findings: No bruising, erythema, lesion or rash.  Neurological:     General: No focal deficit present.     Mental Status: He is alert and oriented to person, place, and time.     Cranial Nerves: No cranial nerve deficit.     Sensory: No sensory deficit.     Motor: No weakness.     Coordination: Coordination normal.     Gait: Gait normal.     Deep Tendon Reflexes: Reflexes normal.  Psychiatric:        Mood and Affect: Mood normal.        Behavior: Behavior normal.  Thought Content: Thought content normal.        Judgment: Judgment normal.        11/06/2023    3:39 PM 11/04/2022    3:44 PM  6CIT Screen  What Year? 0 points 0 points  What month? 0 points 0 points  What time? 0 points 0 points  Count back from 20 0 points 0 points  Months in reverse 0 points 0 points  Repeat phrase 8 points 6 points  Total Score 8 points 6 points     Results for orders placed or performed in visit on 11/04/22  Microalbumin, Urine Waived   Collection Time: 11/04/22  3:20 PM  Result Value Ref Range   Microalb, Ur Waived 10 0 - 19 mg/L   Creatinine, Urine Waived 10 10 - 300 mg/dL    Microalb/Creat Ratio 30-300 (H) <30 mg/g  Urinalysis, Routine w reflex microscopic   Collection Time: 11/04/22  3:20 PM  Result Value Ref Range   Specific Gravity, UA 1.010 1.005 - 1.030   pH, UA 5.5 5.0 - 7.5   Color, UA Yellow Yellow   Appearance Ur Clear Clear   Leukocytes,UA Negative Negative   Protein,UA Negative Negative/Trace   Glucose, UA Negative Negative   Ketones, UA Negative Negative   RBC, UA Negative Negative   Bilirubin, UA Negative Negative   Urobilinogen, Ur 0.2 0.2 - 1.0 mg/dL   Nitrite, UA Negative Negative   Microscopic Examination Comment   CBC with Differential/Platelet   Collection Time: 11/04/22  3:22 PM  Result Value Ref Range   WBC 7.1 3.4 - 10.8 x10E3/uL   RBC 4.84 4.14 - 5.80 x10E6/uL   Hemoglobin 15.6 13.0 - 17.7 g/dL   Hematocrit 09.6 04.5 - 51.0 %   MCV 96 79 - 97 fL   MCH 32.2 26.6 - 33.0 pg   MCHC 33.6 31.5 - 35.7 g/dL   RDW 40.9 81.1 - 91.4 %   Platelets 208 150 - 450 x10E3/uL   Neutrophils 64 Not Estab. %   Lymphs 27 Not Estab. %   Monocytes 7 Not Estab. %   Eos 1 Not Estab. %   Basos 1 Not Estab. %   Neutrophils Absolute 4.6 1.4 - 7.0 x10E3/uL   Lymphocytes Absolute 1.9 0.7 - 3.1 x10E3/uL   Monocytes Absolute 0.5 0.1 - 0.9 x10E3/uL   EOS (ABSOLUTE) 0.1 0.0 - 0.4 x10E3/uL   Basophils Absolute 0.0 0.0 - 0.2 x10E3/uL   Immature Granulocytes 0 Not Estab. %   Immature Grans (Abs) 0.0 0.0 - 0.1 x10E3/uL  PSA   Collection Time: 11/04/22  3:22 PM  Result Value Ref Range   Prostate Specific Ag, Serum 1.6 0.0 - 4.0 ng/mL  TSH   Collection Time: 11/04/22  3:22 PM  Result Value Ref Range   TSH 1.320 0.450 - 4.500 uIU/mL  Comprehensive metabolic panel   Collection Time: 11/04/22  3:22 PM  Result Value Ref Range   Glucose 90 70 - 99 mg/dL   BUN 16 8 - 27 mg/dL   Creatinine, Ser 7.82 0.76 - 1.27 mg/dL   eGFR 77 >95 AO/ZHY/8.65   BUN/Creatinine Ratio 15 10 - 24   Sodium 138 134 - 144 mmol/L   Potassium 4.5 3.5 - 5.2 mmol/L   Chloride 100  96 - 106 mmol/L   CO2 23 20 - 29 mmol/L   Calcium 10.0 8.6 - 10.2 mg/dL   Total Protein 6.8 6.0 - 8.5 g/dL   Albumin 4.9 3.9 -  4.9 g/dL   Globulin, Total 1.9 1.5 - 4.5 g/dL   Albumin/Globulin Ratio 2.6 (H) 1.2 - 2.2   Bilirubin Total 0.4 0.0 - 1.2 mg/dL   Alkaline Phosphatase 66 44 - 121 IU/L   AST 22 0 - 40 IU/L   ALT 19 0 - 44 IU/L  Lipid Panel w/o Chol/HDL Ratio   Collection Time: 11/04/22  3:22 PM  Result Value Ref Range   Cholesterol, Total 199 100 - 199 mg/dL   Triglycerides 161 (H) 0 - 149 mg/dL   HDL 47 >09 mg/dL   VLDL Cholesterol Cal 35 5 - 40 mg/dL   LDL Chol Calc (NIH) 604 (H) 0 - 99 mg/dL      Assessment & Plan:   Problem List Items Addressed This Visit       Cardiovascular and Mediastinum   Senile purpura (HCC)   Reassured patient. Continue to monitor.       Relevant Medications   sildenafil (REVATIO) 20 MG tablet   tadalafil (CIALIS) 20 MG tablet   lisinopril (ZESTRIL) 10 MG tablet   Other Relevant Orders   Comprehensive metabolic panel   CBC with Differential/Platelet     Genitourinary   Benign hypertensive renal disease   Under good control on current regimen. Continue current regimen. Continue to monitor. Call with any concerns. Refills given. Labs drawn today.        Relevant Orders   Microalbumin, Urine Waived   Comprehensive metabolic panel   CBC with Differential/Platelet   TSH     Other   Mixed hyperlipidemia   Rechecking labs today. Await results. Treat as needed.       Relevant Medications   sildenafil (REVATIO) 20 MG tablet   tadalafil (CIALIS) 20 MG tablet   lisinopril (ZESTRIL) 10 MG tablet   Other Relevant Orders   Comprehensive metabolic panel   CBC with Differential/Platelet   Lipid Panel w/o Chol/HDL Ratio   RLS (restless legs syndrome)   Under good control on current regimen. Continue current regimen. Continue to monitor. Call with any concerns. Refills given. Labs drawn today.       Other Visit Diagnoses        Encounter for annual wellness exam in Medicare patient    -  Primary   Preventative care discussed today as below.     Routine general medical examination at a health care facility       Vaccines up to date/declined. Screening labs checked today. Colonoscopy deferred for 6 months given MIL on hospice. Call with concerns. Continue diet/exercise     Hesitancy       Labs drawn today. Await results.   Relevant Orders   Comprehensive metabolic panel   CBC with Differential/Platelet   PSA        Preventative Services:  Health Risk Assessment and Personalized Prevention Plan: Done today Bone Mass Measurements: N/A CVD Screening: Done today Colon Cancer Screening: Will hold off for now  Depression Screening: Done today Diabetes Screening: done today Glaucoma Screening: see your eye doctor Hepatitis B vaccine: N/A Hepatitis C screening: Up to date HIV Screening: up to date Flu Vaccine: up to date Lung cancer Screening: N/A Obesity Screening: done today Pneumonia Vaccines (2): declined STI Screening: N/A PSA screening: done today  LABORATORY TESTING:  Health maintenance labs ordered today as discussed above.   The natural history of prostate cancer and ongoing controversy regarding screening and potential treatment outcomes of prostate cancer has been discussed with the patient. The  meaning of a false positive PSA and a false negative PSA has been discussed. He indicates understanding of the limitations of this screening test and wishes to proceed with screening PSA testing.   IMMUNIZATIONS:   - Tdap: Tetanus vaccination status reviewed: last tetanus booster within 10 years. - Influenza: Up to date - Pneumovax: Refused - Prevnar: Refused - Zostavax vaccine: Refused  SCREENING: - Colonoscopy:  Will hold off until after his MIL passes- under a lot of stress right now.    Discussed with patient purpose of the colonoscopy is to detect colon cancer at curable precancerous or early  stages   PATIENT COUNSELING:    Sexuality: Discussed sexually transmitted diseases, partner selection, use of condoms, avoidance of unintended pregnancy  and contraceptive alternatives.   Advised to avoid cigarette smoking.  I discussed with the patient that most people either abstain from alcohol or drink within safe limits (<=14/week and <=4 drinks/occasion for males, <=7/weeks and <= 3 drinks/occasion for females) and that the risk for alcohol disorders and other health effects rises proportionally with the number of drinks per week and how often a drinker exceeds daily limits.  Discussed cessation/primary prevention of drug use and availability of treatment for abuse.   Diet: Encouraged to adjust caloric intake to maintain  or achieve ideal body weight, to reduce intake of dietary saturated fat and total fat, to limit sodium intake by avoiding high sodium foods and not adding table salt, and to maintain adequate dietary potassium and calcium preferably from fresh fruits, vegetables, and low-fat dairy products.    stressed the importance of regular exercise  Injury prevention: Discussed safety belts, safety helmets, smoke detector, smoking near bedding or upholstery.   Dental health: Discussed importance of regular tooth brushing, flossing, and dental visits.   Follow up plan: NEXT PREVENTATIVE PHYSICAL DUE IN 1 YEAR. Return in about 6 months (around 05/06/2024).

## 2023-11-06 NOTE — Assessment & Plan Note (Signed)
Reassured patient. Continue to monitor.  

## 2023-11-06 NOTE — Assessment & Plan Note (Signed)
Rechecking labs today. Await results. Treat as needed.  °

## 2023-11-06 NOTE — Assessment & Plan Note (Signed)
Under good control on current regimen. Continue current regimen. Continue to monitor. Call with any concerns. Refills given. Labs drawn today.   

## 2023-11-06 NOTE — Patient Instructions (Signed)
Preventative Services:  Health Risk Assessment and Personalized Prevention Plan: Done today Bone Mass Measurements: N/A CVD Screening: Done today Colon Cancer Screening: Will hold off for now  Depression Screening: Done today Diabetes Screening: done today Glaucoma Screening: see your eye doctor Hepatitis B vaccine: N/A Hepatitis C screening: Up to date HIV Screening: up to date Flu Vaccine: up to date Lung cancer Screening: N/A Obesity Screening: done today Pneumonia Vaccines (2): declined STI Screening: N/A PSA screening: done today

## 2023-11-07 LAB — COMPREHENSIVE METABOLIC PANEL
ALT: 21 [IU]/L (ref 0–44)
AST: 21 [IU]/L (ref 0–40)
Albumin: 4.6 g/dL (ref 3.9–4.9)
Alkaline Phosphatase: 61 [IU]/L (ref 44–121)
BUN/Creatinine Ratio: 13 (ref 10–24)
BUN: 13 mg/dL (ref 8–27)
Bilirubin Total: 0.3 mg/dL (ref 0.0–1.2)
CO2: 22 mmol/L (ref 20–29)
Calcium: 9.3 mg/dL (ref 8.6–10.2)
Chloride: 106 mmol/L (ref 96–106)
Creatinine, Ser: 1 mg/dL (ref 0.76–1.27)
Globulin, Total: 2.1 g/dL (ref 1.5–4.5)
Glucose: 113 mg/dL — ABNORMAL HIGH (ref 70–99)
Potassium: 4.3 mmol/L (ref 3.5–5.2)
Sodium: 141 mmol/L (ref 134–144)
Total Protein: 6.7 g/dL (ref 6.0–8.5)
eGFR: 82 mL/min/{1.73_m2} (ref >59)

## 2023-11-07 LAB — CBC WITH DIFFERENTIAL/PLATELET
Basophils Absolute: 0 10*3/uL (ref 0.0–0.2)
Basos: 1 %
EOS (ABSOLUTE): 0.1 10*3/uL (ref 0.0–0.4)
Eos: 1 %
Hematocrit: 45 % (ref 37.5–51.0)
Hemoglobin: 14.9 g/dL (ref 13.0–17.7)
Immature Grans (Abs): 0 10*3/uL (ref 0.0–0.1)
Immature Granulocytes: 0 %
Lymphocytes Absolute: 1.8 10*3/uL (ref 0.7–3.1)
Lymphs: 24 %
MCH: 32.2 pg (ref 26.6–33.0)
MCHC: 33.1 g/dL (ref 31.5–35.7)
MCV: 97 fL (ref 79–97)
Monocytes Absolute: 0.5 10*3/uL (ref 0.1–0.9)
Monocytes: 6 %
Neutrophils Absolute: 5.1 10*3/uL (ref 1.4–7.0)
Neutrophils: 68 %
Platelets: 210 10*3/uL (ref 150–450)
RBC: 4.63 x10E6/uL (ref 4.14–5.80)
RDW: 12.3 % (ref 11.6–15.4)
WBC: 7.5 10*3/uL (ref 3.4–10.8)

## 2023-11-07 LAB — LIPID PANEL W/O CHOL/HDL RATIO
Cholesterol, Total: 203 mg/dL — ABNORMAL HIGH (ref 100–199)
HDL: 43 mg/dL (ref >39)
LDL Chol Calc (NIH): 111 mg/dL — ABNORMAL HIGH (ref 0–99)
Triglycerides: 283 mg/dL — ABNORMAL HIGH (ref 0–149)
VLDL Cholesterol Cal: 49 mg/dL — ABNORMAL HIGH (ref 5–40)

## 2023-11-07 LAB — PSA: Prostate Specific Ag, Serum: 1.4 ng/mL (ref 0.0–4.0)

## 2023-11-07 LAB — TSH: TSH: 1.35 u[IU]/mL (ref 0.450–4.500)

## 2023-11-09 ENCOUNTER — Telehealth: Payer: Self-pay

## 2023-11-09 NOTE — Telephone Encounter (Signed)
-----   Message from Olevia Perches sent at 11/06/2023  3:37 PM EST ----- Covid shot [facility] [employer]

## 2023-11-09 NOTE — Telephone Encounter (Signed)
COVID vaccine has been updated to reflect in his chart. Last COVID vaccine given 08/22/23 at local [facility].

## 2023-12-31 ENCOUNTER — Telehealth: Payer: Self-pay | Admitting: Family Medicine

## 2023-12-31 MED ORDER — OMEPRAZOLE 20 MG PO CPDR
20.0000 mg | DELAYED_RELEASE_CAPSULE | Freq: Every day | ORAL | 3 refills | Status: DC
Start: 2023-12-31 — End: 2024-05-11

## 2023-12-31 NOTE — Telephone Encounter (Signed)
Routing to provider to advise.

## 2023-12-31 NOTE — Telephone Encounter (Signed)
Copied from CRM [id number]. Topic: General - Inquiry >> Dec 31, 2023 11:11 AM Haroldine Laws wrote: Reason for CRM: pt was in the end of December and talked about acid reflux.  Pt is still having this and was told if he was to call back and you would prescribe something.  He uses [facility] [address] [place].Marland Kitchen  CB#  [phone]

## 2024-05-11 ENCOUNTER — Telehealth: Payer: Self-pay | Admitting: Nurse Practitioner

## 2024-05-11 ENCOUNTER — Ambulatory Visit: Payer: Self-pay | Admitting: Family Medicine

## 2024-05-11 MED ORDER — ROPINIROLE HCL 1 MG PO TABS: 1.0000 mg | ORAL_TABLET | Freq: Every day | ORAL | 0 refills | Status: DC

## 2024-05-11 MED ORDER — LISINOPRIL 10 MG PO TABS: 10.0000 mg | ORAL_TABLET | Freq: Every day | ORAL | 0 refills | Status: DC

## 2024-05-11 MED ORDER — OMEPRAZOLE 20 MG PO CPDR: 20.0000 mg | DELAYED_RELEASE_CAPSULE | Freq: Every day | ORAL | 0 refills | Status: DC

## 2024-05-11 NOTE — Telephone Encounter (Signed)
 Patient's appointment had to be rescheduled due to provider being out of the office.  Refill of medications sent in for patient.

## 2024-06-03 ENCOUNTER — Ambulatory Visit (INDEPENDENT_AMBULATORY_CARE_PROVIDER_SITE_OTHER): Admitting: Family Medicine

## 2024-06-03 ENCOUNTER — Encounter: Payer: Self-pay | Admitting: Family Medicine

## 2024-06-03 VITALS — BP 119/72 | HR 59 | Temp 98.0°F | Ht 67.0 in | Wt 162.0 lb

## 2024-06-03 DIAGNOSIS — M25561 Pain in right knee: Secondary | ICD-10-CM

## 2024-06-03 DIAGNOSIS — L821 Other seborrheic keratosis: Secondary | ICD-10-CM

## 2024-06-03 DIAGNOSIS — N529 Male erectile dysfunction, unspecified: Secondary | ICD-10-CM

## 2024-06-03 DIAGNOSIS — E782 Mixed hyperlipidemia: Secondary | ICD-10-CM | POA: Diagnosis not present

## 2024-06-03 DIAGNOSIS — I129 Hypertensive chronic kidney disease with stage 1 through stage 4 chronic kidney disease, or unspecified chronic kidney disease: Secondary | ICD-10-CM | POA: Diagnosis not present

## 2024-06-03 MED ORDER — SILDENAFIL CITRATE 20 MG PO TABS: ORAL_TABLET | ORAL | 12 refills | Status: DC

## 2024-06-03 MED ORDER — LISINOPRIL 10 MG PO TABS: 10.0000 mg | ORAL_TABLET | Freq: Every day | ORAL | 1 refills | Status: DC

## 2024-06-03 MED ORDER — OMEPRAZOLE 20 MG PO CPDR: 20.0000 mg | DELAYED_RELEASE_CAPSULE | Freq: Every day | ORAL | 1 refills | Status: DC

## 2024-06-03 MED ORDER — KETOCONAZOLE 2 % EX CREA: 1.0000 | TOPICAL_CREAM | Freq: Every day | CUTANEOUS | 0 refills | Status: AC

## 2024-06-03 NOTE — Assessment & Plan Note (Signed)
Under fair control on current regimen. Continue current regimen. Continue to monitor. Call with any concerns. Refills given.   

## 2024-06-03 NOTE — Assessment & Plan Note (Signed)
 Rechecking labs today. Await results. Treat as needed.

## 2024-06-03 NOTE — Progress Notes (Signed)
 BP 119/72   Pulse (!) 59   Temp 98 F (36.7 C) (Oral)   Ht 5' 7 (1.702 m)   Wt 162 lb (73.5 kg)   SpO2 97%   BMI 25.37 kg/m    Subjective:    Patient ID: Philip Atkins, male    DOB: [birth year], 68 y.o.   MRN: [id number]  HPI: Philip Atkins is a 68 y.o. male  Chief Complaint  Patient presents with   Hypertension   Hyperlipidemia   Nevus    Left side of head    HYPERTENSION / HYPERLIPIDEMIA Satisfied with current treatment? yes Duration of hypertension: chronic BP monitoring frequency: weekly BP range: 120s  BP medication side effects: no Past BP meds: lisinopril  Duration of hyperlipidemia: chronic Cholesterol medication side effects: N/A Cholesterol supplements: none Past cholesterol medications: none Medication compliance: excellent compliance Aspirin: no Recent stressors: no Recurrent headaches: no Visual changes: no Palpitations: no Dyspnea: no Chest pain: no Lower extremity edema: no Dizzy/lightheaded: no  SKIN LESION Duration: months Location: L side of his head Painful: no Itching: no Onset: gradual Context: unsure- maybe a little bigger History of skin cancer: no History of precancerous skin lesions: no Family history of skin cancer: no   Relevant past medical, surgical, family and social history reviewed and updated as indicated. Interim medical history since our last visit reviewed. Allergies and medications reviewed and updated.  Review of Systems  Constitutional: Negative.   Respiratory: Negative.    Cardiovascular: Negative.   Musculoskeletal:  Positive for arthralgias. Negative for back pain, gait problem, joint swelling, myalgias, neck pain and neck stiffness.  Psychiatric/Behavioral: Negative.      Per HPI unless specifically indicated above     Objective:    BP 119/72   Pulse (!) 59   Temp 98 F (36.7 C) (Oral)   Ht 5' 7 (1.702 m)   Wt 162 lb (73.5 kg)   SpO2 97%   BMI 25.37 kg/m   Wt Readings from Last 3  Encounters:  06/03/24 162 lb (73.5 kg)  11/06/23 165 lb (74.8 kg)  11/04/22 162 lb 1.6 oz (73.5 kg)    Physical Exam Vitals and nursing note reviewed.  Constitutional:      General: He is not in acute distress.    Appearance: Normal appearance. He is not ill-appearing, toxic-appearing or diaphoretic.  HENT:     Head: Normocephalic and atraumatic.     Right Ear: External ear normal.     Left Ear: External ear normal.     Nose: Nose normal.     Mouth/Throat:     Mouth: Mucous membranes are moist.     Pharynx: Oropharynx is clear.  Eyes:     General: No scleral icterus.       Right eye: No discharge.        Left eye: No discharge.     Extraocular Movements: Extraocular movements intact.     Conjunctiva/sclera: Conjunctivae normal.     Pupils: Pupils are equal, round, and reactive to light.  Cardiovascular:     Rate and Rhythm: Normal rate and regular rhythm.     Pulses: Normal pulses.     Heart sounds: Normal heart sounds. No murmur heard.    No friction rub. No gallop.  Pulmonary:     Effort: Pulmonary effort is normal. No respiratory distress.     Breath sounds: Normal breath sounds. No stridor. No wheezing, rhonchi or rales.  Chest:     Chest wall: No tenderness.  Musculoskeletal:        General: Normal range of motion.     Cervical back: Normal range of motion and neck supple.  Skin:    General: Skin is warm and dry.     Capillary Refill: Capillary refill takes less than 2 seconds.     Coloration: Skin is not jaundiced or pale.     Findings: No bruising, erythema, lesion or rash.  Neurological:     General: No focal deficit present.     Mental Status: He is alert and oriented to person, place, and time. Mental status is at baseline.  Psychiatric:        Mood and Affect: Mood normal.        Behavior: Behavior normal.        Thought Content: Thought content normal.        Judgment: Judgment normal.     Results for orders placed or performed in visit on 11/06/23   Microalbumin, Urine Waived   Collection Time: 11/06/23  3:19 PM  Result Value Ref Range   Microalb, Ur Waived 30 (H) 0 - 19 mg/L   Creatinine, Urine Waived 200 10 - 300 mg/dL   Microalb/Creat Ratio <30 <30 mg/g  Comprehensive metabolic panel   Collection Time: 11/06/23  3:20 PM  Result Value Ref Range   Glucose 113 (H) 70 - 99 mg/dL   BUN 13 8 - 27 mg/dL   Creatinine, Ser 8.99 0.76 - 1.27 mg/dL   eGFR 82 >40 fO/fpw/8.26   BUN/Creatinine Ratio 13 10 - 24   Sodium 141 134 - 144 mmol/L   Potassium 4.3 3.5 - 5.2 mmol/L   Chloride 106 96 - 106 mmol/L   CO2 22 20 - 29 mmol/L   Calcium  9.3 8.6 - 10.2 mg/dL   Total Protein 6.7 6.0 - 8.5 g/dL   Albumin 4.6 3.9 - 4.9 g/dL   Globulin, Total 2.1 1.5 - 4.5 g/dL   Bilirubin Total 0.3 0.0 - 1.2 mg/dL   Alkaline Phosphatase 61 44 - 121 IU/L   AST 21 0 - 40 IU/L   ALT 21 0 - 44 IU/L  CBC with Differential/Platelet   Collection Time: 11/06/23  3:20 PM  Result Value Ref Range   WBC 7.5 3.4 - 10.8 x10E3/uL   RBC 4.63 4.14 - 5.80 x10E6/uL   Hemoglobin 14.9 13.0 - 17.7 g/dL   Hematocrit 54.9 62.4 - 51.0 %   MCV 97 79 - 97 fL   MCH 32.2 26.6 - 33.0 pg   MCHC 33.1 31.5 - 35.7 g/dL   RDW 87.6 88.3 - 84.5 %   Platelets 210 150 - 450 x10E3/uL   Neutrophils 68 Not Estab. %   Lymphs 24 Not Estab. %   Monocytes 6 Not Estab. %   Eos 1 Not Estab. %   Basos 1 Not Estab. %   Neutrophils Absolute 5.1 1.4 - 7.0 x10E3/uL   Lymphocytes Absolute 1.8 0.7 - 3.1 x10E3/uL   Monocytes Absolute 0.5 0.1 - 0.9 x10E3/uL   EOS (ABSOLUTE) 0.1 0.0 - 0.4 x10E3/uL   Basophils Absolute 0.0 0.0 - 0.2 x10E3/uL   Immature Granulocytes 0 Not Estab. %   Immature Grans (Abs) 0.0 0.0 - 0.1 x10E3/uL  Lipid Panel w/o Chol/HDL Ratio   Collection Time: 11/06/23  3:20 PM  Result Value Ref Range   Cholesterol, Total 203 (H) 100 - 199 mg/dL   Triglycerides 716 (H) 0 - 149 mg/dL   HDL 43 >60 mg/dL  VLDL Cholesterol Cal 49 (H) 5 - 40 mg/dL   LDL Chol Calc (NIH) 888 (H) 0 - 99  mg/dL  PSA   Collection Time: 11/06/23  3:20 PM  Result Value Ref Range   Prostate Specific Ag, Serum 1.4 0.0 - 4.0 ng/mL  TSH   Collection Time: 11/06/23  3:20 PM  Result Value Ref Range   TSH 1.350 0.450 - 4.500 uIU/mL      Assessment & Plan:   Problem List Items Addressed This Visit       Genitourinary   Benign hypertensive renal disease - Primary   Under good control on current regimen. Continue current regimen. Continue to monitor. Call with any concerns. Refills given. Labs drawn today.        Relevant Orders   CBC with Differential/Platelet   Comprehensive metabolic panel with GFR     Other   ED (erectile dysfunction)   Under fair control on current regimen. Continue current regimen. Continue to monitor. Call with any concerns. Refills given.        Mixed hyperlipidemia   Rechecking labs today. Await results. Treat as needed.       Relevant Medications   lisinopril  (ZESTRIL ) 10 MG tablet   sildenafil  (REVATIO ) 20 MG tablet   Other Relevant Orders   CBC with Differential/Platelet   Comprehensive metabolic panel with GFR   Lipid Panel w/o Chol/HDL Ratio   Other Visit Diagnoses       Seborrheic keratoses       Discussed removal. Will try ketoconazole cream. Call with any concerns.     Acute pain of right knee       Would like to see ortho- referral generated today.   Relevant Orders   Ambulatory referral to Orthopedic Surgery        Follow up plan: Return in about 6 months (around 12/04/2024) for physical.

## 2024-06-03 NOTE — Assessment & Plan Note (Signed)
 Under good control on current regimen. Continue current regimen. Continue to monitor. Call with any concerns. Refills given. Labs drawn today.

## 2024-06-04 LAB — LIPID PANEL W/O CHOL/HDL RATIO
Cholesterol, Total: 196 mg/dL (ref 100–199)
HDL: 46 mg/dL (ref >39)
LDL Chol Calc (NIH): 111 mg/dL — ABNORMAL HIGH (ref 0–99)
Triglycerides: 224 mg/dL — ABNORMAL HIGH (ref 0–149)
VLDL Cholesterol Cal: 39 mg/dL (ref 5–40)

## 2024-06-04 LAB — COMPREHENSIVE METABOLIC PANEL WITH GFR
ALT: 21 IU/L (ref 0–44)
AST: 23 IU/L (ref 0–40)
Albumin: 4.8 g/dL (ref 3.9–4.9)
Alkaline Phosphatase: 69 IU/L (ref 44–121)
BUN/Creatinine Ratio: 15 (ref 10–24)
BUN: 16 mg/dL (ref 8–27)
Bilirubin Total: 0.3 mg/dL (ref 0.0–1.2)
CO2: 22 mmol/L (ref 20–29)
Calcium: 9.9 mg/dL (ref 8.6–10.2)
Chloride: 103 mmol/L (ref 96–106)
Creatinine, Ser: 1.09 mg/dL (ref 0.76–1.27)
Globulin, Total: 2.2 g/dL (ref 1.5–4.5)
Glucose: 87 mg/dL (ref 70–99)
Potassium: 4.9 mmol/L (ref 3.5–5.2)
Sodium: 140 mmol/L (ref 134–144)
Total Protein: 7 g/dL (ref 6.0–8.5)
eGFR: 74 mL/min/1.73 (ref >59)

## 2024-06-04 LAB — CBC WITH DIFFERENTIAL/PLATELET
Basophils Absolute: 0 x10E3/uL (ref 0.0–0.2)
Basos: 1 %
EOS (ABSOLUTE): 0.1 x10E3/uL (ref 0.0–0.4)
Eos: 1 %
Hematocrit: 47.9 % (ref 37.5–51.0)
Hemoglobin: 15.6 g/dL (ref 13.0–17.7)
Immature Grans (Abs): 0 x10E3/uL (ref 0.0–0.1)
Immature Granulocytes: 0 %
Lymphocytes Absolute: 1.4 x10E3/uL (ref 0.7–3.1)
Lymphs: 22 %
MCH: 32 pg (ref 26.6–33.0)
MCHC: 32.6 g/dL (ref 31.5–35.7)
MCV: 98 fL — ABNORMAL HIGH (ref 79–97)
Monocytes Absolute: 0.5 x10E3/uL (ref 0.1–0.9)
Monocytes: 8 %
Neutrophils Absolute: 4.2 x10E3/uL (ref 1.4–7.0)
Neutrophils: 68 %
Platelets: 200 x10E3/uL (ref 150–450)
RBC: 4.87 x10E6/uL (ref 4.14–5.80)
RDW: 12.5 % (ref 11.6–15.4)
WBC: 6.1 x10E3/uL (ref 3.4–10.8)

## 2024-06-06 ENCOUNTER — Ambulatory Visit: Payer: Self-pay | Admitting: Family Medicine

## 2024-06-07 DIAGNOSIS — M1711 Unilateral primary osteoarthritis, right knee: Secondary | ICD-10-CM | POA: Diagnosis not present

## 2024-06-28 DIAGNOSIS — M1711 Unilateral primary osteoarthritis, right knee: Secondary | ICD-10-CM | POA: Diagnosis not present

## 2024-07-25 ENCOUNTER — Telehealth: Payer: Self-pay

## 2024-07-25 DIAGNOSIS — F17210 Nicotine dependence, cigarettes, uncomplicated: Secondary | ICD-10-CM

## 2024-07-25 MED ORDER — COVID-19 MRNA VAC-TRIS(PFIZER) 30 MCG/0.3ML IM SUSY: 0.3000 mL | PREFILLED_SYRINGE | Freq: Once | INTRAMUSCULAR | 0 refills | Status: AC

## 2024-07-25 NOTE — Addendum Note (Signed)
 Addended by: VICCI DUWAINE SQUIBB on: 07/25/2024 01:31 PM   Modules accepted: Orders

## 2024-07-25 NOTE — Telephone Encounter (Signed)
 He shouldn't need one since he's over 38- but I've written one out, we can fax it over.

## 2024-07-25 NOTE — Telephone Encounter (Signed)
 Just sent an electronic covid shot Rx- can we check in in a couple of days and make sure he was able to get it?

## 2024-07-25 NOTE — Telephone Encounter (Signed)
 Copied from CRM [id number]. Topic: Clinical - Medication Question >> Jul 25, 2024  8:50 AM Berwyn MATSU wrote: Reason for RMF:Ejupzwu'd wife  called in today to requesting a prescription for covid vaccine [facility] the [facility] on file.   [facility] [address], [place] - [address] [place] [address] Phone: [phone] Fax: [phone] Hours: Not open 24 hours

## 2024-07-26 ENCOUNTER — Telehealth: Payer: Self-pay

## 2024-07-26 DIAGNOSIS — F17210 Nicotine dependence, cigarettes, uncomplicated: Secondary | ICD-10-CM

## 2024-07-26 MED ORDER — SPIKEVAX COVID-19 VACCINE 100 MCG/0.5ML IM SUSP: 0.5000 mL | Freq: Once | INTRAMUSCULAR | 0 refills | Status: AC

## 2024-07-26 NOTE — Telephone Encounter (Signed)
 Rx for different covid vaccines sent to his pharmacy

## 2024-08-15 LAB — FECAL OCCULT BLOOD, IMMUNOCHEMICAL: IFOBT: NEGATIVE

## 2024-08-29 ENCOUNTER — Encounter: Payer: Self-pay | Admitting: Family Medicine

## 2024-09-09 ENCOUNTER — Encounter: Payer: Self-pay | Admitting: Family Medicine

## 2024-11-07 ENCOUNTER — Ambulatory Visit (INDEPENDENT_AMBULATORY_CARE_PROVIDER_SITE_OTHER): Admitting: Family Medicine

## 2024-11-07 VITALS — BP 138/82 | HR 61 | Temp 98.0°F | Ht 67.0 in | Wt 161.0 lb

## 2024-11-07 DIAGNOSIS — R3911 Hesitancy of micturition: Secondary | ICD-10-CM | POA: Diagnosis not present

## 2024-11-07 DIAGNOSIS — Z1211 Encounter for screening for malignant neoplasm of colon: Secondary | ICD-10-CM | POA: Diagnosis not present

## 2024-11-07 DIAGNOSIS — I129 Hypertensive chronic kidney disease with stage 1 through stage 4 chronic kidney disease, or unspecified chronic kidney disease: Secondary | ICD-10-CM

## 2024-11-07 DIAGNOSIS — G2581 Restless legs syndrome: Secondary | ICD-10-CM | POA: Diagnosis not present

## 2024-11-07 DIAGNOSIS — E782 Mixed hyperlipidemia: Secondary | ICD-10-CM | POA: Diagnosis not present

## 2024-11-07 DIAGNOSIS — Z0000 Encounter for general adult medical examination without abnormal findings: Secondary | ICD-10-CM

## 2024-11-07 DIAGNOSIS — D692 Other nonthrombocytopenic purpura: Secondary | ICD-10-CM

## 2024-11-07 LAB — MICROALBUMIN, URINE WAIVED
Creatinine, Urine Waived: 300 mg/dL (ref 10–300)
Microalb, Ur Waived: 30 mg/L — ABNORMAL HIGH (ref 0–19)
Microalb/Creat Ratio: <30 mg/g

## 2024-11-07 MED ORDER — PREDNISONE 50 MG PO TABS: 50.0000 mg | ORAL_TABLET | Freq: Every day | ORAL | 0 refills | Status: AC

## 2024-11-07 MED ORDER — LISINOPRIL 10 MG PO TABS: 10.0000 mg | ORAL_TABLET | Freq: Every day | ORAL | 1 refills | Status: AC

## 2024-11-07 MED ORDER — OMEPRAZOLE 20 MG PO CPDR: 20.0000 mg | DELAYED_RELEASE_CAPSULE | Freq: Every day | ORAL | 1 refills | Status: AC

## 2024-11-07 MED ORDER — TADALAFIL 20 MG PO TABS: 10.0000 mg | ORAL_TABLET | ORAL | 11 refills | Status: AC | PRN

## 2024-11-07 MED ORDER — SILDENAFIL CITRATE 20 MG PO TABS: ORAL_TABLET | ORAL | 12 refills | Status: AC

## 2024-11-07 NOTE — Assessment & Plan Note (Signed)
 Rechecking labs today. Await results. Treat as needed.

## 2024-11-07 NOTE — Assessment & Plan Note (Signed)
Doing well. No concerns. Continue to monitor.  

## 2024-11-07 NOTE — Assessment & Plan Note (Signed)
 Reassured patient. Continue to monitor.

## 2024-11-07 NOTE — Assessment & Plan Note (Signed)
 Under good control on current regimen. Continue current regimen. Continue to monitor. Call with any concerns. Refills given. Labs drawn today.

## 2024-11-07 NOTE — Progress Notes (Signed)
 "  Chief Complaint  Patient presents with   Medicare Wellness   Annual Exam     Subjective:   Philip Atkins is a 68 y.o. male who presents for a Medicare Annual Wellness Visit.  Visit info / Clinical Intake: Medicare Wellness Visit Type:: Subsequent Annual Wellness Visit Persons participating in visit and providing information:: patient Medicare Wellness Visit Mode:: In-person (required for WTM) Interpreter Needed?: No Pre-visit prep was completed: no AWV questionnaire completed by patient prior to visit?: yes Date:: 11/07/24 Living arrangements:: lives with spouse/significant other Patient's Overall Health Status Rating: excellent Typical amount of pain: none Does pain affect daily life?: no Are you currently prescribed opioids?: no  Dietary Habits and Nutritional Risks How many meals a day?: 3 Eats fruit and vegetables daily?: yes Most meals are obtained by: preparing own meals In the last 2 weeks, have you had any of the following?: none Diabetic:: no  Functional Status Activities of Daily Living (to include ambulation/medication): Independent Ambulation: Independent Medication Administration: Independent Home Management (perform basic housework or laundry): Independent Manage your own finances?: yes Primary transportation is: driving Concerns about vision?: no *vision screening is required for WTM* Concerns about hearing?: no (patient states he has been noticing a high pitched sound in his L ear every morning when waking up)  Fall Screening Falls in the past year?: 0 Number of falls in past year: 0 Was there an injury with Fall?: 0 Fall Risk Category Calculator: 0 Patient Fall Risk Level: Low Fall Risk  Fall Risk Patient at Risk for Falls Due to: No Fall Risks Fall risk Follow up: Falls evaluation completed  Home and Transportation Safety: All rugs have non-skid backing?: yes All stairs or steps have railings?: yes Grab bars in the bathtub or shower?:  yes Have non-skid surface in bathtub or shower?: (!) no Good home lighting?: yes Regular seat belt use?: yes Hospital stays in the last year:: no  Cognitive Assessment Difficulty concentrating, remembering, or making decisions? : no Will 6CIT or Mini Cog be Completed: yes What year is it?: 0 points What month is it?: 0 points Give patient an address phrase to remember (5 components): [address] in [place], [place] About what time is it?: 0 points Count backwards from 20 to 1: 0 points Say the months of the year in reverse: 0 points Repeat the address phrase from earlier: 0 points 6 CIT Score: 0 points  Advance Directives (For Healthcare) Does Patient Have a Medical Advance Directive?: Yes Does patient want to make changes to medical advance directive?: No - Patient declined Type of Advance Directive: Living will; Healthcare Power of Attorney Copy of Healthcare Power of Attorney in Chart?: No - copy requested Copy of Living Will in Chart?: No - copy requested  Reviewed/Updated  Reviewed/Updated: Reviewed All (Medical, Surgical, Family, Medications, Allergies, Care Teams, Patient Goals)    Allergies (verified) Patient has no known allergies.   Current Medications (verified) Outpatient Encounter Medications as of 11/07/2024  Medication Sig   acetaminophen  (TYLENOL ) 500 MG tablet Take 500 mg by mouth 2 (two) times daily.   ketoconazole  (NIZORAL ) 2 % cream Apply 1 Application topically daily.   Multiple Vitamin (MULTIVITAMIN) tablet Take 1 tablet by mouth daily.   Omeprazole  Magnesium (PRILOSEC) 2.5 MG PACK Take by mouth. 14 day course , currently on day 12 as of 11/06/23   predniSONE  (DELTASONE ) 50 MG tablet Take 1 tablet (50 mg total) by mouth daily with breakfast.   [DISCONTINUED] lisinopril  (ZESTRIL ) 10 MG tablet  Take 1 tablet (10 mg total) by mouth daily.   [DISCONTINUED] omeprazole  (PRILOSEC) 20 MG capsule Take 1 capsule (20 mg total) by mouth daily.   [DISCONTINUED]  sildenafil  (REVATIO ) 20 MG tablet Take 5 pills by mouth daily prior to intimacy;   [DISCONTINUED] tadalafil  (CIALIS ) 20 MG tablet Take 0.5-1 tablets (10-20 mg total) by mouth every other day as needed for erectile dysfunction.   lisinopril  (ZESTRIL ) 10 MG tablet Take 1 tablet (10 mg total) by mouth daily.   omeprazole  (PRILOSEC) 20 MG capsule Take 1 capsule (20 mg total) by mouth daily.   sildenafil  (REVATIO ) 20 MG tablet Take 5 pills by mouth daily prior to intimacy;   tadalafil  (CIALIS ) 20 MG tablet Take 0.5-1 tablets (10-20 mg total) by mouth every other day as needed for erectile dysfunction.   No facility-administered encounter medications on file as of 11/07/2024.    History: Past Medical History:  Diagnosis Date   Abscess of right buttock 11/03/2015   Abscess of right hip 11/03/2015   ED (erectile dysfunction)    Gastric ulceration    GERD (gastroesophageal reflux disease)    Hypertension    Migraines    MRSA (methicillin resistant Staphylococcus aureus) 04/2015   Neoplasm of uncertain behavior    Neoplasm of uncertain behavior    Squamous cell carcinoma 06/2013   Past Surgical History:  Procedure Laterality Date   COLONOSCOPY  08/08/11   1 benign polyp; one tubular adenoma; repeat 5 years   COLONOSCOPY WITH PROPOFOL  N/A 10/05/2018   Procedure: COLONOSCOPY WITH PROPOFOL ;  Surgeon: Therisa Bi, MD;  Location[facility];  Service: Gastroenterology;  Laterality: N/A;   FINGER TENDON REPAIR     Family History  Problem Relation Age of Onset   Heart attack Mother    Hypertension Mother    Heart disease Mother    Diabetes Brother    Asthma Brother    Heart disease Maternal Grandmother    Cancer Neg Hx    COPD Neg Hx    Stroke Neg Hx    Social History   Occupational History   Not on file  Tobacco Use   Smoking status: Every Day    Current packs/day: 0.25    Types: Cigarettes   Smokeless tobacco: Never  Vaping Use   Vaping status: Some Days  Substance and  Sexual Activity   Alcohol use: Never   Drug use: Never   Sexual activity: Yes   Tobacco Counseling Ready to quit: No Counseling given: Not Answered  SDOH Screenings   Food Insecurity: No Food Insecurity (11/07/2024)  Housing: Unknown (11/07/2024)  Transportation Needs: No Transportation Needs (11/07/2024)  Utilities: Not At Risk (11/07/2024)  Depression (PHQ2-9): Low Risk (11/07/2024)  Financial Resource Strain: Low Risk (11/07/2024)  Physical Activity: Sufficiently Active (11/07/2024)  Social Connections: Moderately Isolated (11/07/2024)  Stress: No Stress Concern Present (11/07/2024)  Tobacco Use: High Risk (11/07/2024)  Health Literacy: Adequate Health Literacy (11/07/2024)   See flowsheets for full screening details  Depression Screen PHQ 2 & 9 Depression Scale- Over the past 2 weeks, how often have you been bothered by any of the following problems? Little interest or pleasure in doing things: 0 Feeling down, depressed, or hopeless (PHQ Adolescent also includes...irritable): 0 PHQ-2 Total Score: 0 Trouble falling or staying asleep, or sleeping too much: 0 Feeling tired or having little energy: 0 Poor appetite or overeating (PHQ Adolescent also includes...weight loss): 0 Feeling bad about yourself - or that you are a failure or have let  yourself or your family down: 0 Trouble concentrating on things, such as reading the newspaper or watching television (PHQ Adolescent also includes...like school work): 0 Moving or speaking so slowly that other people could have noticed. Or the opposite - being so fidgety or restless that you have been moving around a lot more than usual: 0 Thoughts that you would be better off dead, or of hurting yourself in some way: 0 PHQ-9 Total Score: 0 If you checked off any problems, how difficult have these problems made it for you to do your work, take care of things at home, or get along with other people?: Not difficult at all     Goals  Addressed   None          Objective:    Today's Vitals   11/07/24 1303 11/07/24 1312 11/07/24 1330  BP: (!) 149/75 (!) 149/79 138/82  Pulse: (!) 57 61   Temp: 98 F (36.7 C)    TempSrc: Oral    SpO2: 98%    Weight: 161 lb (73 kg)    Height: 5' 7 (1.702 m)    PainSc: 0-No pain     Body mass index is 25.22 kg/m.  Hearing/Vision screen No results found. Immunizations and Health Maintenance Health Maintenance  Topic Date Due   Zoster Vaccines- Shingrix (1 of 2) Never done   Colonoscopy  10/05/2021   Lung Cancer Screening  11/07/2025 (Originally 06/13/2005)   DTaP/Tdap/Td (3 - Td or Tdap) 11/07/2025 (Originally 01/30/2024)   COVID-19 Vaccine (8 - Pfizer risk 2025-26 season) 02/01/2025   COLON CANCER SCREENING ANNUAL FOBT  08/15/2025   Medicare Annual Wellness (AWV)  11/07/2025   Pneumococcal Vaccine: 50+ Years  Completed   Influenza Vaccine  Completed   Hepatitis C Screening  Completed   Meningococcal B Vaccine  Aged Out        Assessment/Plan:  This is a routine wellness examination for Philip Atkins.  Patient Care Team: Vicci Duwaine SQUIBB, DO as PCP - General (Family Medicine)  I have personally reviewed and noted the following in the patients chart:   Medical and social history Use of alcohol, tobacco or illicit drugs  Current medications and supplements including opioid prescriptions. Functional ability and status Nutritional status Physical activity Advanced directives List of other physicians Hospitalizations, surgeries, and ER visits in previous 12 months Vitals Screenings to include cognitive, depression, and falls Referrals and appointments  Orders Placed This Encounter  Procedures   Comprehensive metabolic panel with GFR    Has the patient fasted?:   Yes   CBC with Differential/Platelet   Lipid Panel w/o Chol/HDL Ratio    Has the patient fasted?:   Yes   PSA   TSH   Microalbumin, Urine Waived   Ambulatory referral to Gastroenterology    Referral  Priority:   Routine    Referral Type:   Consultation    Referral Reason:   Specialty Services Required    Number of Visits Requested:   1   In addition, I have reviewed and discussed with patient certain preventive protocols, quality metrics, and best practice recommendations. A written personalized care plan for preventive services as well as general preventive health recommendations were provided to patient.   Duwaine Vicci, DO   11/07/2024   Return in about 6 months (around 05/08/2025).  After Visit Summary: (In Person-Printed) AVS printed and given to the patient   "

## 2024-11-07 NOTE — Patient Instructions (Signed)
 Preventative Services:  Health Risk Assessment and Personalized Prevention Plan: done today Bone Mass Measurements: N/A CVD Screening: done today Colon Cancer Screening: ordered today Depression Screening: done today Diabetes Screening: done today Glaucoma Screening: see your eye doctor Hepatitis B vaccine: N/A Hepatitis C screening: up to date HIV Screening: up to date Flu Vaccine: up to date Lung cancer Screening: will get next year Obesity Screening: done today Pneumonia Vaccines: up to date STI Screening: N/A PSA screening: done today  Philip Atkins,  Thank you for taking the time for your Medicare Wellness Visit. I appreciate your continued commitment to your health goals. Please review the care plan we discussed, and feel free to reach out if I can assist you further.  Please note that Annual Wellness Visits do not include a physical exam. Some assessments may be limited, especially if the visit was conducted virtually. If needed, we may recommend an in-person follow-up with your provider.  Ongoing Care Seeing your primary care provider every 3 to 6 months helps us  monitor your health and provide consistent, personalized care.   Referrals If a referral was made during today's visit and you haven't received any updates within two weeks, please contact the referred provider directly to check on the status.  Recommended Screenings:  Health Maintenance  Topic Date Due   Zoster (Shingles) Vaccine (1 of 2) Never done   Colon Cancer Screening  10/05/2021   Screening for Lung Cancer  11/07/2025*   DTaP/Tdap/Td vaccine (3 - Td or Tdap) 11/07/2025*   COVID-19 Vaccine (8 - Pfizer risk 2025-26 season) 02/01/2025   Stool Blood Test  08/15/2025   Medicare Annual Wellness Visit  11/07/2025   Pneumococcal Vaccine for age over 65  Completed   Flu Shot  Completed   Hepatitis C Screening  Completed   Meningitis B Vaccine  Aged Out  *Topic was postponed. The date shown is not the  original due date.       11/07/2024    1:06 PM  Advanced Directives  Does Patient Have a Medical Advance Directive? Yes  Type of Advance Directive Living will;Healthcare Power of Attorney  Does patient want to make changes to medical advance directive? No - Patient declined  Copy of Healthcare Power of Attorney in Chart? No - copy requested    Vision: Annual vision screenings are recommended for early detection of glaucoma, cataracts, and diabetic retinopathy. These exams can also reveal signs of chronic conditions such as diabetes and high blood pressure.  Dental: Annual dental screenings help detect early signs of oral cancer, gum disease, and other conditions linked to overall health, including heart disease and diabetes.  Please see the attached documents for additional preventive care recommendations.

## 2024-11-07 NOTE — Progress Notes (Signed)
 "  BP 138/82   Pulse 61   Temp 98 F (36.7 C) (Oral)   Ht 5' 7 (1.702 m)   Wt 161 lb (73 kg)   SpO2 98%   BMI 25.22 kg/m    Subjective:    Patient ID: Philip Atkins, male    DOB: [birth year], 68 y.o.   MRN: [id number]  HPI: Philip Atkins is a 68 y.o. male presenting on 11/07/2024 for comprehensive medical examination. Current medical complaints include:  HYPERTENSION / HYPERLIPIDEMIA Satisfied with current treatment? yes Duration of hypertension: chronic BP monitoring frequency: not checking BP medication side effects: no Past BP meds: lisinopril  Duration of hyperlipidemia: chronic Cholesterol medication side effects: no Cholesterol supplements: none Past cholesterol medications: none Medication compliance: excellent compliance Aspirin: no Recent stressors: no Recurrent headaches: no Visual changes: no Palpitations: no Dyspnea: no Chest pain: no Lower extremity edema: no Dizzy/lightheaded: no   He currently lives with: wife Interim Problems from his last visit: no  Functional Status Survey: Is the patient deaf or have difficulty hearing?: No Does the patient have difficulty seeing, even when wearing glasses/contacts?: No Does the patient have difficulty concentrating, remembering, or making decisions?: No Does the patient have difficulty walking or climbing stairs?: No Does the patient have difficulty dressing or bathing?: No Does the patient have difficulty doing errands alone such as visiting a doctor's office or shopping?: No  FALL RISK:    11/07/2024    1:06 PM 11/04/2022    3:17 PM 05/30/2022    3:35 PM 12/20/2021    2:21 PM 06/10/2021    1:55 PM  Fall Risk   Falls in the past year? 0 0 0 0 1  Number falls in past yr: 0 0 0 0 0  Injury with Fall? 0 0  0  0  0   Risk for fall due to : No Fall Risks No Fall Risks No Fall Risks No Fall Risks No Fall Risks  Follow up Falls evaluation completed Falls evaluation completed  Falls evaluation completed  Falls  evaluation completed  Falls evaluation completed      Data saved with a previous flowsheet row definition    Depression Screen    11/07/2024    1:13 PM 11/06/2023    3:09 PM 11/04/2022    3:17 PM 05/30/2022    3:35 PM 12/20/2021    2:21 PM  Depression screen PHQ 2/9  Decreased Interest 0 1 0 0 0  Down, Depressed, Hopeless 0 0 0 1 1  PHQ - 2 Score 0 1 0 1 1  Altered sleeping 0 0 0 1 1  Tired, decreased energy 0 1 0 0 1  Change in appetite 0 0 0 0 0  Feeling bad or failure about yourself  0 0 0 0 0  Trouble concentrating 0 0 0 0 0  Moving slowly or fidgety/restless 0 0 0 0 0  Suicidal thoughts 0 0 0 0 0  PHQ-9 Score 0 2  0  2  3   Difficult doing work/chores Not difficult at all  Not difficult at all Not difficult at all      Data saved with a previous flowsheet row definition    Advanced Directives Does patient have a HCPOA?    no If yes, name and contact information:  Does patient have a living will or MOST form?  no  Past Medical History:  Past Medical History:  Diagnosis Date   Abscess of right buttock 11/03/2015   Abscess  of right hip 11/03/2015   ED (erectile dysfunction)    Gastric ulceration    GERD (gastroesophageal reflux disease)    Hypertension    Migraines    MRSA (methicillin resistant Staphylococcus aureus) 04/2015   Neoplasm of uncertain behavior    Neoplasm of uncertain behavior    Squamous cell carcinoma 06/2013    Surgical History:  Past Surgical History:  Procedure Laterality Date   COLONOSCOPY  08/08/11   1 benign polyp; one tubular adenoma; repeat 5 years   COLONOSCOPY WITH PROPOFOL  N/A 10/05/2018   Procedure: COLONOSCOPY WITH PROPOFOL ;  Surgeon: Therisa Bi, MD;  Location: [facility] END[facility]COPY;  Service: Gastroenterology;  Laterality: N/A;   FINGER TENDON REPAIR      Medications:  Current Outpatient Medications on File Prior to Visit  Medication Sig   acetaminophen  (TYLENOL ) 500 MG tablet Take 500 mg by mouth 2 (two) times daily.    ketoconazole  (NIZORAL ) 2 % cream Apply 1 Application topically daily.   Multiple Vitamin (MULTIVITAMIN) tablet Take 1 tablet by mouth daily.   Omeprazole  Magnesium (PRILOSEC) 2.5 MG PACK Take by mouth. 14 day course , currently on day 12 as of 11/06/23   No current facility-administered medications on file prior to visit.    Allergies:  Allergies[1]  Social History:  Social History   Socioeconomic History   Marital status: Married    Spouse name: Not on file   Number of children: Not on file   Years of education: Not on file   Highest education level: Associate degree: occupational, scientist, product/process development, or vocational program  Occupational History   Not on file  Tobacco Use   Smoking status: Every Day    Current packs/day: 0.25    Types: Cigarettes   Smokeless tobacco: Never  Vaping Use   Vaping status: Some Days  Substance and Sexual Activity   Alcohol use: Never   Drug use: Never   Sexual activity: Yes  Other Topics Concern   Not on file  Social History Narrative   Not on file   Social Drivers of Health   Tobacco Use: High Risk (11/07/2024)   Patient History    Smoking Tobacco Use: Every Day    Smokeless Tobacco Use: Never    Passive Exposure: Not on file  Financial Resource Strain: Low Risk (11/07/2024)   Overall Financial Resource Strain (CARDIA)    Difficulty of Paying Living Expenses: Not very hard  Food Insecurity: No Food Insecurity (11/07/2024)   Epic    Worried About Radiation Protection Practitioner of Food in the Last Year: Never true    Ran Out of Food in the Last Year: Never true  Transportation Needs: No Transportation Needs (11/07/2024)   Epic    Lack of Transportation (Medical): No    Lack of Transportation (Non-Medical): No  Physical Activity: Sufficiently Active (11/07/2024)   Exercise Vital Sign    Days of Exercise per Week: 4 days    Minutes of Exercise per Session: 90 min  Stress: No Stress Concern Present (11/07/2024)   [employer] of Occupational Health -  Occupational Stress Questionnaire    Feeling of Stress: Not at all  Social Connections: Moderately Isolated (11/07/2024)   Social Connection and Isolation Panel    Frequency of Communication with Friends and Family: Three times a week    Frequency of Social Gatherings with Friends and Family: Twice a week    Attends Religious Services: Never    Database Administrator or Organizations: No    Attends [employer]  or Organization Meetings: Not on file    Marital Status: Married  Intimate Partner Violence: Not At Risk (11/07/2024)   Epic    Fear of Current or Ex-Partner: No    Emotionally Abused: No    Physically Abused: No    Sexually Abused: No  Depression (PHQ2-9): Low Risk (11/07/2024)   Depression (PHQ2-9)    PHQ-2 Score: 0  Alcohol Screen: Not on file  Housing: Unknown (11/07/2024)   Epic    Unable to Pay for Housing in the Last Year: No    Number of Times Moved in the Last Year: Not on file    Homeless in the Last Year: No  Utilities: Not At Risk (11/07/2024)   Epic    Threatened with loss of utilities: No  Health Literacy: Adequate Health Literacy (11/07/2024)   B1300 Health Literacy    Frequency of need for help with medical instructions: Never   Tobacco Use History[2] Social History   Substance and Sexual Activity  Alcohol Use Never    Family History:  Family History  Problem Relation Age of Onset   Heart attack Mother    Hypertension Mother    Heart disease Mother    Diabetes Brother    Asthma Brother    Heart disease Maternal Grandmother    Cancer Neg Hx    COPD Neg Hx    Stroke Neg Hx     Past medical history, surgical history, medications, allergies, family history and social history reviewed with patient today and changes made to appropriate areas of the chart.   Review of Systems  Constitutional: Negative.   HENT:  Positive for congestion and tinnitus. Negative for ear discharge, ear pain, hearing loss, nosebleeds, sinus pain and sore throat.   Eyes:  Negative.   Respiratory: Negative.  Negative for stridor.   Cardiovascular: Negative.   Gastrointestinal: Negative.   Genitourinary: Negative.   Musculoskeletal: Negative.   Skin: Negative.   Neurological:  Positive for dizziness (a few days ago, gone now). Negative for tingling, tremors, sensory change, speech change, focal weakness, seizures, loss of consciousness, weakness and headaches.  Endo/Heme/Allergies:  Negative for environmental allergies and polydipsia. Bruises/bleeds easily.  Psychiatric/Behavioral: Negative.     All other ROS negative except what is listed above and in the HPI.      Objective:    BP 138/82   Pulse 61   Temp 98 F (36.7 C) (Oral)   Ht 5' 7 (1.702 m)   Wt 161 lb (73 kg)   SpO2 98%   BMI 25.22 kg/m   Wt Readings from Last 3 Encounters:  11/07/24 161 lb (73 kg)  06/03/24 162 lb (73.5 kg)  11/06/23 165 lb (74.8 kg)     Physical Exam Vitals and nursing note reviewed.  Constitutional:      General: He is not in acute distress.    Appearance: Normal appearance. He is not ill-appearing, toxic-appearing or diaphoretic.  HENT:     Head: Normocephalic and atraumatic.     Right Ear: Tympanic membrane, ear canal and external ear normal. There is no impacted cerumen.     Left Ear: Tympanic membrane, ear canal and external ear normal. There is no impacted cerumen.     Nose: Nose normal. No congestion or rhinorrhea.     Mouth/Throat:     Mouth: Mucous membranes are moist.     Pharynx: Oropharynx is clear. No oropharyngeal exudate or posterior oropharyngeal erythema.  Eyes:     General: No scleral  icterus.       Right eye: No discharge.        Left eye: No discharge.     Extraocular Movements: Extraocular movements intact.     Conjunctiva/sclera: Conjunctivae normal.     Pupils: Pupils are equal, round, and reactive to light.  Neck:     Vascular: No carotid bruit.  Cardiovascular:     Rate and Rhythm: Normal rate and regular rhythm.     Pulses:  Normal pulses.     Heart sounds: No murmur heard.    No friction rub. No gallop.  Pulmonary:     Effort: Pulmonary effort is normal. No respiratory distress.     Breath sounds: Normal breath sounds. No stridor. No wheezing, rhonchi or rales.  Chest:     Chest wall: No tenderness.  Abdominal:     General: Abdomen is flat. Bowel sounds are normal. There is no distension.     Palpations: Abdomen is soft. There is no mass.     Tenderness: There is no abdominal tenderness. There is no right CVA tenderness, left CVA tenderness, guarding or rebound.     Hernia: No hernia is present.  Genitourinary:    Comments: Genital exam deferred with shared decision making Musculoskeletal:        General: No swelling, tenderness, deformity or signs of injury.     Cervical back: Normal range of motion and neck supple. No rigidity. No muscular tenderness.     Right lower leg: No edema.     Left lower leg: No edema.  Lymphadenopathy:     Cervical: No cervical adenopathy.  Skin:    General: Skin is warm and dry.     Capillary Refill: Capillary refill takes less than 2 seconds.     Coloration: Skin is not jaundiced or pale.     Findings: No bruising, erythema, lesion or rash.  Neurological:     General: No focal deficit present.     Mental Status: He is alert and oriented to person, place, and time.     Cranial Nerves: No cranial nerve deficit.     Sensory: No sensory deficit.     Motor: No weakness.     Coordination: Coordination normal.     Gait: Gait normal.     Deep Tendon Reflexes: Reflexes normal.  Psychiatric:        Mood and Affect: Mood normal.        Behavior: Behavior normal.        Thought Content: Thought content normal.        Judgment: Judgment normal.        11/07/2024    1:06 PM 11/06/2023    3:39 PM 11/04/2022    3:44 PM  6CIT Screen  What Year? 0 points 0 points 0 points  What month? 0 points 0 points 0 points  What time? 0 points 0 points 0 points  Count back from 20 0  points 0 points 0 points  Months in reverse 0 points 0 points 0 points  Repeat phrase 0 points 8 points 6 points  Total Score 0 points 8 points 6 points    Results for orders placed or performed in visit on 08/29/24  Fecal occult blood, imunochemical   Collection Time: 08/15/24 12:00 AM  Result Value Ref Range   IFOBT Negative       Assessment & Plan:   Problem List Items Addressed This Visit       Cardiovascular and Mediastinum  Senile purpura   Reassured patient. Continue to monitor.       Relevant Medications   lisinopril  (ZESTRIL ) 10 MG tablet   sildenafil  (REVATIO ) 20 MG tablet   tadalafil  (CIALIS ) 20 MG tablet   Other Relevant Orders   Comprehensive metabolic panel with GFR   CBC with Differential/Platelet     Genitourinary   Benign hypertensive renal disease   Under good control on current regimen. Continue current regimen. Continue to monitor. Call with any concerns. Refills given. Labs drawn today.        Relevant Orders   Comprehensive metabolic panel with GFR   CBC with Differential/Platelet   TSH   Microalbumin, Urine Waived     Other   Mixed hyperlipidemia   Rechecking labs today. Await results. Treat as needed.       Relevant Medications   lisinopril  (ZESTRIL ) 10 MG tablet   sildenafil  (REVATIO ) 20 MG tablet   tadalafil  (CIALIS ) 20 MG tablet   Other Relevant Orders   Comprehensive metabolic panel with GFR   CBC with Differential/Platelet   Lipid Panel w/o Chol/HDL Ratio   RLS (restless legs syndrome)   Doing well. No concerns. Continue to monitor.       Relevant Orders   Comprehensive metabolic panel with GFR   CBC with Differential/Platelet   Other Visit Diagnoses       Encounter for annual wellness exam in Medicare patient    -  Primary   Preventative care discussed today as below.     Routine general medical examination at a health care facility       Vaccines up to date/declined. Screening labs checked today. Colonscopy ordered  today. Continue diet and exercise. Call with any concerns.     Hesitancy       Labs drawn today.   Relevant Orders   PSA     Screening for colon cancer       Colonoscopy ordered today.   Relevant Orders   Ambulatory referral to Gastroenterology        Preventative Services:  Health Risk Assessment and Personalized Prevention Plan: done today Bone Mass Measurements: N/A CVD Screening: done today Colon Cancer Screening: ordered today Depression Screening: done today Diabetes Screening: done today Glaucoma Screening: see your eye doctor Hepatitis B vaccine: N/A Hepatitis C screening: up to date HIV Screening: up to date Flu Vaccine: up to date Lung cancer Screening: will get next year Obesity Screening: done today Pneumonia Vaccines: up to date STI Screening: N/A PSA screening: done today  Discussed aspirin prophylaxis for myocardial infarction prevention and decision was it was not indicated  LABORATORY TESTING:  Health maintenance labs ordered today as discussed above.   The natural history of prostate cancer and ongoing controversy regarding screening and potential treatment outcomes of prostate cancer has been discussed with the patient. The meaning of a false positive PSA and a false negative PSA has been discussed. He indicates understanding of the limitations of this screening test and wishes to proceed with screening PSA testing.   IMMUNIZATIONS:   - Tdap: Tetanus vaccination status reviewed: declined. - Influenza: Up to date - Pneumovax: Up to date - Prevnar: Up to date - Zostavax vaccine: Refused  SCREENING: - Colonoscopy: declined- FOBT done in September  Discussed with patient purpose of the colonoscopy is to detect colon cancer at curable precancerous or early stages   PATIENT COUNSELING:    Sexuality: Discussed sexually transmitted diseases, partner selection, use of condoms, avoidance of unintended pregnancy  and contraceptive alternatives.   Advised  to avoid cigarette smoking.  I discussed with the patient that most people either abstain from alcohol or drink within safe limits (<=14/week and <=4 drinks/occasion for males, <=7/weeks and <= 3 drinks/occasion for females) and that the risk for alcohol disorders and other health effects rises proportionally with the number of drinks per week and how often a drinker exceeds daily limits.  Discussed cessation/primary prevention of drug use and availability of treatment for abuse.   Diet: Encouraged to adjust caloric intake to maintain  or achieve ideal body weight, to reduce intake of dietary saturated fat and total fat, to limit sodium intake by avoiding high sodium foods and not adding table salt, and to maintain adequate dietary potassium and calcium  preferably from fresh fruits, vegetables, and low-fat dairy products.    stressed the importance of regular exercise  Injury prevention: Discussed safety belts, safety helmets, smoke detector, smoking near bedding or upholstery.   Dental health: Discussed importance of regular tooth brushing, flossing, and dental visits.   Follow up plan: NEXT PREVENTATIVE PHYSICAL DUE IN 1 YEAR. Return in about 6 months (around 05/08/2025).     [1] No Known Allergies [2]  Social History Tobacco Use  Smoking Status Every Day   Current packs/day: 0.25   Types: Cigarettes  Smokeless Tobacco Never   "

## 2024-11-08 ENCOUNTER — Other Ambulatory Visit (HOSPITAL_COMMUNITY): Payer: Self-pay

## 2024-11-08 ENCOUNTER — Telehealth: Payer: Self-pay

## 2024-11-08 ENCOUNTER — Ambulatory Visit: Payer: Self-pay | Admitting: Family Medicine

## 2024-11-08 LAB — CBC WITH DIFFERENTIAL/PLATELET
Basophils Absolute: 0 x10E3/uL (ref 0.0–0.2)
Basos: 0 %
EOS (ABSOLUTE): 0.1 x10E3/uL (ref 0.0–0.4)
Eos: 1 %
Hematocrit: 45.8 % (ref 37.5–51.0)
Hemoglobin: 15.3 g/dL (ref 13.0–17.7)
Immature Grans (Abs): 0 x10E3/uL (ref 0.0–0.1)
Immature Granulocytes: 0 %
Lymphocytes Absolute: 1.9 x10E3/uL (ref 0.7–3.1)
Lymphs: 27 %
MCH: 32.3 pg (ref 26.6–33.0)
MCHC: 33.4 g/dL (ref 31.5–35.7)
MCV: 97 fL (ref 79–97)
Monocytes Absolute: 0.4 x10E3/uL (ref 0.1–0.9)
Monocytes: 5 %
Neutrophils Absolute: 4.6 x10E3/uL (ref 1.4–7.0)
Neutrophils: 67 %
Platelets: 204 x10E3/uL (ref 150–450)
RBC: 4.73 x10E6/uL (ref 4.14–5.80)
RDW: 11.8 % (ref 11.6–15.4)
WBC: 6.9 x10E3/uL (ref 3.4–10.8)

## 2024-11-08 LAB — LIPID PANEL W/O CHOL/HDL RATIO
Cholesterol, Total: 211 mg/dL — ABNORMAL HIGH (ref 100–199)
HDL: 44 mg/dL
LDL Chol Calc (NIH): 119 mg/dL — ABNORMAL HIGH (ref 0–99)
Triglycerides: 274 mg/dL — ABNORMAL HIGH (ref 0–149)
VLDL Cholesterol Cal: 48 mg/dL — ABNORMAL HIGH (ref 5–40)

## 2024-11-08 LAB — COMPREHENSIVE METABOLIC PANEL WITH GFR
ALT: 21 IU/L (ref 0–44)
AST: 22 IU/L (ref 0–40)
Albumin: 4.7 g/dL (ref 3.9–4.9)
Alkaline Phosphatase: 71 IU/L (ref 47–123)
BUN/Creatinine Ratio: 17 (ref 10–24)
BUN: 17 mg/dL (ref 8–27)
Bilirubin Total: 0.4 mg/dL (ref 0.0–1.2)
CO2: 23 mmol/L (ref 20–29)
Calcium: 9.9 mg/dL (ref 8.6–10.2)
Chloride: 100 mmol/L (ref 96–106)
Creatinine, Ser: 1 mg/dL (ref 0.76–1.27)
Globulin, Total: 2.4 g/dL (ref 1.5–4.5)
Glucose: 91 mg/dL (ref 70–99)
Potassium: 4.3 mmol/L (ref 3.5–5.2)
Sodium: 138 mmol/L (ref 134–144)
Total Protein: 7.1 g/dL (ref 6.0–8.5)
eGFR: 81 mL/min/1.73

## 2024-11-08 LAB — TSH: TSH: 1.04 u[IU]/mL (ref 0.450–4.500)

## 2024-11-08 LAB — PSA: Prostate Specific Ag, Serum: 1.8 ng/mL (ref 0.0–4.0)

## 2024-11-08 NOTE — Telephone Encounter (Signed)
 Pharmacy Patient Advocate Encounter   Received notification from Onbase that prior authorization for Sildenafil  Citrate (PAH) 20MG  tablets is required/requested.   Insurance verification completed.   The patient is insured through [facility].   Per test claim: PA required; PA submitted to above mentioned insurance via Latent Key/confirmation #/EOC [id number] Status is pending

## 2024-11-09 NOTE — Telephone Encounter (Signed)
 Pharmacy Patient Advocate Encounter  Received notification from [employer] that Prior Authorization for  Sildenafil  Citrate (PAH) 20MG  tablets  has been DENIED.  Full denial letter will be uploaded to the media tab. See denial reason below.   PA #/Case ID/Reference #: [id number]

## 2024-11-18 ENCOUNTER — Other Ambulatory Visit (HOSPITAL_COMMUNITY): Payer: Self-pay

## 2025-05-09 ENCOUNTER — Ambulatory Visit: Admitting: Family Medicine
